# Patient Record
Sex: Female | Born: 1995 | Race: White | Hispanic: No | Marital: Single | Smoking: Never smoker
Health system: Southern US, Community
[De-identification: ages and names within clinical notes are randomized; demographics above are authoritative.]

## PROBLEM LIST (undated history)

## (undated) ENCOUNTER — Inpatient Hospital Stay (HOSPITAL_COMMUNITY): Payer: Self-pay

## (undated) DIAGNOSIS — Z789 Other specified health status: Secondary | ICD-10-CM

## (undated) DIAGNOSIS — O0933 Supervision of pregnancy with insufficient antenatal care, third trimester: Secondary | ICD-10-CM

## (undated) DIAGNOSIS — O0932 Supervision of pregnancy with insufficient antenatal care, second trimester: Secondary | ICD-10-CM

## (undated) HISTORY — PX: NO PAST SURGERIES: SHX2092

## (undated) HISTORY — DX: Supervision of pregnancy with insufficient antenatal care, third trimester: O09.33

## (undated) HISTORY — DX: Supervision of pregnancy with insufficient antenatal care, second trimester: O09.32

---

## 2014-10-10 NOTE — L&D Delivery Note (Signed)
Delivery Note At 11:33 AM a viable female was delivered via Vaginal, Spontaneous Delivery (Presentation: ; Occiput Anterior).  APGAR: 9, 9; weight 7 lb 10.9 oz (3485 g).   Placenta status: Intact, Spontaneous.  Cord: 3 vessels with the following complications: None.  Cord pH: n/a  Anesthesia: Epidural  Episiotomy: None Lacerations: 2nd degree Suture Repair: 2.0 vicryl Est. Blood Loss (mL): 100  Mom to postpartum.  Baby to Couplet care / Skin to Skin.  Burlin Mcnair DARLENE 05/03/1015 1200

## 2015-02-14 ENCOUNTER — Encounter (HOSPITAL_COMMUNITY): Payer: Self-pay

## 2015-02-14 ENCOUNTER — Inpatient Hospital Stay (HOSPITAL_COMMUNITY)
Admission: AD | Admit: 2015-02-14 | Discharge: 2015-02-14 | Disposition: A | Discharge status: Home / Self Care | Payer: Medicaid Other | Source: Ambulatory Visit | Attending: Obstetrics and Gynecology | Admitting: Obstetrics and Gynecology

## 2015-02-14 DIAGNOSIS — O0932 Supervision of pregnancy with insufficient antenatal care, second trimester: Secondary | ICD-10-CM | POA: Diagnosis not present

## 2015-02-14 DIAGNOSIS — O9989 Other specified diseases and conditions complicating pregnancy, childbirth and the puerperium: Secondary | ICD-10-CM | POA: Insufficient documentation

## 2015-02-14 DIAGNOSIS — Z3A27 27 weeks gestation of pregnancy: Secondary | ICD-10-CM | POA: Diagnosis not present

## 2015-02-14 NOTE — Discharge Instructions (Signed)
Second Trimester of Pregnancy The second trimester is from week 13 through week 28, month 4 through 6. This is often the time in pregnancy that you feel your best. Often times, morning sickness has lessened or quit. You may have more energy, and you may get hungry more often. Your unborn baby (fetus) is growing rapidly. At the end of the sixth month, he or she is about 9 inches long and weighs about 1 pounds. You will likely feel the baby move (quickening) between 18 and 20 weeks of pregnancy. HOME CARE   Avoid all smoking, herbs, and alcohol. Avoid drugs not approved by your doctor.  Only take medicine as told by your doctor. Some medicines are safe and some are not during pregnancy.  Exercise only as told by your doctor. Stop exercising if you start having cramps.  Eat regular, healthy meals.  Wear a good support bra if your breasts are tender.  Do not use hot tubs, steam rooms, or saunas.  Wear your seat belt when driving.  Avoid raw meat, uncooked cheese, and liter boxes and soil used by cats.  Take your prenatal vitamins.  Try taking medicine that helps you poop (stool softener) as needed, and if your doctor approves. Eat more fiber by eating fresh fruit, vegetables, and whole grains. Drink enough fluids to keep your pee (urine) clear or pale yellow.  Take warm water baths (sitz baths) to soothe pain or discomfort caused by hemorrhoids. Use hemorrhoid cream if your doctor approves.  If you have puffy, bulging veins (varicose veins), wear support hose. Raise (elevate) your feet for 15 minutes, 3-4 times a day. Limit salt in your diet.  Avoid heavy lifting, wear low heals, and sit up straight.  Rest with your legs raised if you have leg cramps or low back pain.  Visit your dentist if you have not gone during your pregnancy. Use a soft toothbrush to brush your teeth. Be gentle when you floss.  You can have sex (intercourse) unless your doctor tells you not to.  Go to your  doctor visits. GET HELP IF:   You feel dizzy.  You have mild cramps or pressure in your lower belly (abdomen).  You have a nagging pain in your belly area.  You continue to feel sick to your stomach (nauseous), throw up (vomit), or have watery poop (diarrhea).  You have bad smelling fluid coming from your vagina.  You have pain with peeing (urination). GET HELP RIGHT AWAY IF:   You have a fever.  You are leaking fluid from your vagina.  You have spotting or bleeding from your vagina.  You have severe belly cramping or pain.  You lose or gain weight rapidly.  You have trouble catching your breath and have chest pain.  You notice sudden or extreme puffiness (swelling) of your face, hands, ankles, feet, or legs.  You have not felt the baby move in over an hour.  You have severe headaches that do not go away with medicine.  You have vision changes. Document Released: 12/21/2009 Document Revised: 01/21/2013 Document Reviewed: 11/27/2012 ExitCare Patient Information 2015 [employer]. This information is not intended to replace advice given to you by your health care provider. Make sure you discuss any questions you have with your health care provider.  

## 2015-02-14 NOTE — [facility] Provider Note (Signed)
Ms. Anna [name] is a 19 y.o. G1P0 at 3273w2d who presents to [facility] today with complaint of no prenatal care. The patient denies abdominal pain, vaginal bleeding, LOF today. She states that she desires a water birth and has started the Medicaid process but has not found an OB yet.   BP 118/69 mmHg  Pulse 88  Temp(Src) 97.9 F (36.6 C) (Oral)  Resp 16  Ht 5' 1.75" (1.568 m)  Wt 150 lb 8 oz (68.266 kg)  BMI 27.77 kg/m2  LMP 08/07/2014 (Exact Date)  CONSTITUTIONAL: Well-developed, well-nourished female in no acute distress.  ENT: External right and left ear normal.  EYES: EOM intact, conjunctivae normal.  MUSCULOSKELETAL: Normal range of motion.  CARDIOVASCULAR: Regular heart rate RESPIRATORY: Normal effort NEUROLOGICAL: Alert and oriented to person, place, and time.  SKIN: Skin is warm and dry. No rash noted. Not diaphoretic. No erythema. No pallor. PSYCH: Normal mood and affect. Normal behavior. Normal judgment and thought content.  MDM FHR - 150 bpm with doppler  A: [facility] at 4973w2d No prenatal care  P: Discharge home Preterm labor precautions discussed Patient advised to follow-up with [facility] to start prenatal Patient may return to [facility] as needed or if her condition were to change or worsen  Marny LowensteinJulie N Tramya Schoenfelder, PA-C  02/14/2015 12:30 PM

## 2015-02-14 NOTE — MAU Note (Signed)
Pt states here for ultrasound to determine gender, as well as to start prenatal care. Denies pain or bleeding.

## 2015-02-25 ENCOUNTER — Ambulatory Visit (HOSPITAL_COMMUNITY)
Admission: RE | Admit: 2015-02-25 | Discharge: 2015-02-25 | Disposition: A | Discharge status: Home / Self Care | Payer: Medicaid Other | Source: Ambulatory Visit | Attending: Medical | Admitting: Medical

## 2015-02-25 ENCOUNTER — Ambulatory Visit (INDEPENDENT_AMBULATORY_CARE_PROVIDER_SITE_OTHER): Payer: Medicaid Other | Admitting: Advanced Practice Midwife

## 2015-02-25 ENCOUNTER — Encounter: Payer: Self-pay | Admitting: Advanced Practice Midwife

## 2015-02-25 VITALS — BP 123/80 | HR 80 | Temp 97.9°F | Wt 155.2 lb

## 2015-02-25 DIAGNOSIS — O0932 Supervision of pregnancy with insufficient antenatal care, second trimester: Secondary | ICD-10-CM | POA: Insufficient documentation

## 2015-02-25 DIAGNOSIS — O0933 Supervision of pregnancy with insufficient antenatal care, third trimester: Secondary | ICD-10-CM | POA: Insufficient documentation

## 2015-02-25 DIAGNOSIS — Z3689 Encounter for other specified antenatal screening: Secondary | ICD-10-CM | POA: Insufficient documentation

## 2015-02-25 DIAGNOSIS — Z3403 Encounter for supervision of normal first pregnancy, third trimester: Secondary | ICD-10-CM

## 2015-02-25 DIAGNOSIS — Z3483 Encounter for supervision of other normal pregnancy, third trimester: Secondary | ICD-10-CM

## 2015-02-25 DIAGNOSIS — Z36 Encounter for antenatal screening of mother: Secondary | ICD-10-CM | POA: Diagnosis not present

## 2015-02-25 DIAGNOSIS — Z3A28 28 weeks gestation of pregnancy: Secondary | ICD-10-CM | POA: Insufficient documentation

## 2015-02-25 DIAGNOSIS — Z3493 Encounter for supervision of normal pregnancy, unspecified, third trimester: Secondary | ICD-10-CM | POA: Insufficient documentation

## 2015-02-25 LAB — POCT URINALYSIS DIP (DEVICE)
BILIRUBIN URINE: NEGATIVE
Glucose, UA: NEGATIVE mg/dL
Hgb urine dipstick: NEGATIVE
Ketones, ur: NEGATIVE mg/dL
NITRITE: NEGATIVE
PH: 5 (ref 5.0–8.0)
PROTEIN: NEGATIVE mg/dL
Specific Gravity, Urine: <=1.005 (ref 1.005–1.030)
Urobilinogen, UA: 0.2 mg/dL (ref 0.0–1.0)

## 2015-02-25 NOTE — Progress Notes (Signed)
Weight gain 25-35lbs Declined tdap and flu vaccine; info given  Pt wants to have a water birth

## 2015-02-25 NOTE — Patient Instructions (Signed)
Preterm Labor Information Preterm labor is when labor starts at less than 37 weeks of pregnancy. The normal length of a pregnancy is 39 to 41 weeks. CAUSES Often, there is no identifiable underlying cause as to why a woman goes into preterm labor. One of the most common known causes of preterm labor is infection. Infections of the uterus, cervix, vagina, amniotic sac, bladder, kidney, or even the lungs (pneumonia) can cause labor to start. Other suspected causes of preterm labor include:   Urogenital infections, such as yeast infections and bacterial vaginosis.   Uterine abnormalities (uterine shape, uterine septum, fibroids, or bleeding from the placenta).   A cervix that has been operated on (it may fail to stay closed).   Malformations in the fetus.   Multiple gestations (twins, triplets, and so on).   Breakage of the amniotic sac.  RISK FACTORS  Having a previous history of preterm labor.   Having premature rupture of membranes (PROM).   Having a placenta that covers the opening of the cervix (placenta previa).   Having a placenta that separates from the uterus (placental abruption).   Having a cervix that is too weak to hold the fetus in the uterus (incompetent cervix).   Having too much fluid in the amniotic sac (polyhydramnios).   Taking illegal drugs or smoking while pregnant.   Not gaining enough weight while pregnant.   Being younger than 18 and older than 20 years old.   Having a low socioeconomic status.   Being African American. SYMPTOMS Signs and symptoms of preterm labor include:   Menstrual-like cramps, abdominal pain, or back pain.  Uterine contractions that are regular, as frequent as six in an hour, regardless of their intensity (may be mild or painful).  Contractions that start on the top of the uterus and spread down to the lower abdomen and back.   A sense of increased pelvic pressure.   A watery or bloody mucus discharge that  comes from the vagina.  TREATMENT Depending on the length of the pregnancy and other circumstances, your health care provider may suggest bed rest. If necessary, there are medicines that can be given to stop contractions and to mature the fetal lungs. If labor happens before 34 weeks of pregnancy, a prolonged hospital stay may be recommended. Treatment depends on the condition of both you and the fetus.  WHAT SHOULD YOU DO IF YOU THINK YOU ARE IN PRETERM LABOR? Call your health care provider right away. You will need to go to the hospital to get checked immediately. HOW CAN YOU PREVENT PRETERM LABOR IN FUTURE PREGNANCIES? You should:   Stop smoking if you smoke.  Maintain healthy weight gain and avoid chemicals and drugs that are not necessary.  Be watchful for any type of infection.  Inform your health care provider if you have a known history of preterm labor. Document Released: 12/17/2003 Document Revised: 05/29/2013 Document Reviewed: 10/29/2012 ExitCare Patient Information 2015 [employer]. This information is not intended to replace advice given to you by your health care provider. Make sure you discuss any questions you have with your health care provider.  Breastfeeding Challenges and Solutions Even though breastfeeding is natural, it can be challenging, especially in the first few weeks after childbirth. It is normal for problems to arise when starting to breastfeed your new baby, even if you have breastfed before. This document provides some solutions to the most common breastfeeding challenges.  CHALLENGES AND SOLUTIONS Challenge--Cracked or Sore Nipples Cracked or sore nipples   are commonly experienced by breastfeeding mothers. Cracked or sore nipples often are caused by inadequate latching (when your baby's mouth attaches to your breast to breastfeed). Soreness can also happen if your baby is not positioned properly at your breast. Although nipple cracking and soreness are  common during the first week after birth, nipple pain is never normal. If you experience nipple cracking or soreness that lasts longer than 1 week or nipple pain, call your health care provider or lactation consultant.  Solution Ensure proper latching and positioning of your baby by following the steps below:  Find a comfortable place to sit or lie down, with your neck and back well supported.  Place a pillow or rolled up blanket under your baby to bring him or her to the level of your breast (if you are seated).  Make sure that your baby's abdomen is facing your abdomen.  Gently massage your breast. With your fingertips, massage from your chest wall toward your nipple in a circular motion. This encourages milk flow. You may need to continue this action during the feeding if your milk flows slowly.  Support your breast with 4 fingers underneath and your thumb above your nipple. Make sure your fingers are well away from your nipple and your baby's mouth.  Stroke your baby's lips gently with your finger or nipple.  When your baby's mouth is open wide enough, quickly bring your baby to your breast, placing your entire nipple and as much of the colored area around your nipple (areola) as possible into your baby's mouth.  More areola should be visible above your baby's upper lip than below the lower lip.  Your baby's tongue should be between his or her lower gum and your breast.  Ensure that your baby's mouth is correctly positioned around your nipple (latched). Your baby's lips should create a seal on your breast and be turned out (everted).  It is common for your baby to suck for about 2-3 minutes in order to start the flow of breast milk. Signs that your baby has successfully latched on to your nipple include:   Quietly tugging or quietly sucking without causing you pain.   Swallowing heard between every 3-4 sucks.   Muscle movement above and in front of his or her ears with sucking.   Signs that your baby has not successfully latched on to nipple include:   Sucking sounds or smacking sounds from your baby while nursing.   Nipple pain.  Ensure that your breasts stay moisturized and healthy by:  Avoiding the use of soap on your nipples.   Wearing a supportive bra. Avoid wearing underwire-style bras or tight bras.   Air drying your nipples for 3-4 minutes after each feeding.   Using only cotton bra pads to absorb breast milk leakage. Leaking of breast milk between feedings is normal. Be sure to change the pads if they become soaked with milk.  Using lanolin on your nipples after nursing. Lanolin helps to maintain your skin's normal moisture barrier. If you use pure lanolin you do not need to wash it off before feeding your baby again. Pure lanolin is not toxic to your baby. You may also hand express a few drops of breast milk and gently massage that milk into your nipples, allowing it to air dry. Challenge--Breast Engorgement Breast engorgement is the overfilling of your breasts with breast milk. In the first few weeks after giving birth, you may experience breast engorgement. Breast engorgement can make your breasts throb   and feel hard, tightly stretched, warm, and tender. Engorgement peaks about the fifth day after you give birth. Having breast engorgement does not mean you have to stop breastfeeding your baby. Solution  Breastfeed when you feel the need to reduce the fullness of your breasts or when your baby shows signs of hunger. This is called "breastfeeding on demand."  Newborns (babies younger than 4 weeks) often breastfeed every 1-3 hours during the day. You may need to awaken your baby to feed if he or she is asleep at a feeding time.  Do not allow your baby to sleep longer than 5 hours during the night without a feeding.  Pump or hand express breast milk before breastfeeding to soften your breast, areola, and nipple.  Apply warm, moist heat (in the  shower or with warm water-soaked hand towels) just before feeding or pumping, or massage your breast before or during breastfeeding. This increases circulation and helps your milk to flow.  Completely empty your breasts when breastfeeding or pumping. Afterward, wear a snug bra (nursing or regular) or tank top for 1-2 days to signal your body to slightly decrease milk production. Only wear snug bras or tank tops to treat engorgement. Tight bras typically should be avoided by breastfeeding mothers. Once engorgement is relieved, return to wearing regular, loose-fitting clothes.  Apply ice packs to your breasts to lessen the pain from engorgement and relieve swelling, unless the ice is uncomfortable for you.  Do not delay feedings. Try to relax when it is time to feed your baby. This helps to trigger your "let-down reflex," which releases milk from your breast.  Ensure your baby is latched on to your breast and positioned properly while breastfeeding.  Allow your baby to remain at your breast as long as he or she is latched on well and actively sucking. Your baby will let you know when he or she is done breastfeeding by pulling away from your breast or falling asleep.  Avoid introducing bottles or pacifiers to your baby in the early weeks of breastfeeding. Wait to introduce these things until after resolving any breastfeeding challenges.  Try to pump your milk on the same schedule as when your baby would breastfeed if you are returning to work or away from home for an extended period.  Drink plenty of fluids to avoid dehydration, which can eventually put you at greater risk of breast engorgement. If you follow these suggestions, your engorgement should improve in 24-48 hours. If you are still experiencing difficulty, call your lactation consultant or health care provider.  Challenge--Plugged Milk Ducts Plugged milk ducts occur when the duct does not drain milk effectively and becomes swollen. Wearing  a tight-fitting nursing bra or having difficulty with latching may cause plugged milk ducts. Not drinking enough water (8-10 c [1.9-2.4 L] per day) can contribute to plugged milk ducts. Once a duct has become plugged, hard lumps, soreness, and redness may develop in your breast.  Solution Do not delay feedings. Feed your baby frequently and try to empty your breasts of milk at each feeding. Try breastfeeding from the affected side first so there is a better chance that the milk will drain completely from that breast. Apply warm, moist towels to your breasts for 5-10 minutes before feeding. Alternatively, a hot shower right before breastfeeding can provide the moist heat that can encourage milk flow. Gentle massage of the sore area before and during a feeding may also help. Avoid wearing tight clothing or bras that put pressure   on your breasts. Wear bras that offer good support to your breasts, but avoid underwire bras. If you have a plugged milk duct and develop a fever, you need to see your health care provider.  Challenge--Mastitis Mastitis is inflammation of your breast. It usually is caused by a bacterial infection and can cause flu-like symptoms. You may develop redness in your breast and a fever. Often when mastitis occurs, your breast becomes firm, warm, and very painful. The most common causes of mastitis are poor latching, ineffective sucking from your baby, consistent pressure on your breast (possibly from wearing a tight-fitting bra or shirt that restricts the milk flow), unusual stress or fatigue, or missed feedings.  Solution You will be given antibiotic medicine to treat the infection. It is still important to breastfeed frequently to empty your breasts. Continuing to breastfeed while you recover from mastitis will not harm your baby. Make sure your baby is positioned properly during every feeding. Apply moist heat to your breasts for a few minutes before feeding to help the milk flow and to help  your breasts empty more easily. Challenge--Thrush Thrush is a yeast infection that can form on your nipples, in your breast, or in your baby's mouth. It causes itching, soreness, burning or stabbing pain, and sometimes a rash.  Solution You will be given a medicated ointment for your nipples, and your baby will be given a liquid medicine for his or her mouth. It is important that you and your baby are treated at the same time because thrush can be passed between you and your baby. Change disposable nursing pads often. Any bras, towels, or clothing that come in contact with infected areas of your body or your baby's body need to be washed in very hot water every day. Wash your hands and your baby's hands often. All pacifiers, bottle nipples, or toys your baby puts in his or her mouth should be boiled once a day for 20 minutes. After 1 week of treatment, discard pacifiers and bottle nipples and buy new ones. All breast pump parts that touch the milk need to be boiled for 20 minutes every day. Challenge--Low Milk Supply You may not be producing enough milk if your baby is not gaining the proper amount of weight. Breast milk production is based on a supply-and-demand system. Your milk supply depends on how frequently and effectively your baby empties your breast. Solution The more you breastfeed and pump, the more breast milk you will produce. It is important that your baby empties at least one of your breasts at each feeding. If this is not happening, then use a breast pump or hand express any milk that remains. This will help to drain as much milk as possible at each feeding. It will also signal your body to produce more milk. If your baby is not emptying your breasts, it may be due to latching, sucking, or positioning problems. If low milk supply continues after addressing these issues, contact your health care provider or a lactation specialist as soon as possible. Challenge--Inverted or Flat Nipples Some  women have nipples that turn inward instead of protruding outward. Other women have nipples that are flat. Inverted or flat nipples can sometimes make it more difficult for your baby to latch onto your breast. Solution You may be given a small device that pulls out inverted nipples. This device should be applied right before your baby is brought to your breast. You can also try using a breast pump for a short   time before placing the baby at your breast. The pump can pull your nipple outwards to help your infant latch more easily. The baby's sucking motion will help the inverted nipple protrude as well.  If you have flat nipples, encourage your baby to latch onto your breast and feed frequently in the early days after birth. This will give your baby practice latching on correctly while your breast is still soft. When your milk supply increases, between the second and fifth day after birth and your breasts become full, your baby will have an easier time latching.  Contact a lactation consultant if you still have concerns. She or he can teach you additional techniques to address breastfeeding problems related to nipple shape and position.  FOR MORE INFORMATION [employer]: www.llli.org Document Released: 03/20/2006 Document Revised: 10/01/2013 Document Reviewed: 03/22/2013 ExitCare Patient Information 2015 [employer]. This information is not intended to replace advice given to you by your health care provider. Make sure you discuss any questions you have with your health care provider.  

## 2015-02-25 NOTE — Progress Notes (Signed)
   Subjective:    Anna [name] is a G1P0 2429w6d being seen today for her first obstetrical visit.  Her obstetrical history is significant for Late to care, teen pregnancy. Patient does intend to breast feed. Pregnancy history fully reviewed.  Patient reports no complaints.  Filed Vitals:   02/25/15 0938  BP: 123/80  Pulse: 80  Temp: 97.9 F (36.6 C)  Weight: 155 lb 3.2 oz (70.398 kg)    HISTORY: OB History  Gravida Para Term Preterm AB SAB TAB Ectopic Multiple Living  1             # Outcome Date GA Lbr Len/2nd Weight Sex Delivery Anes PTL Lv  1 Current              History reviewed. No pertinent past medical history. History reviewed. No pertinent past surgical history. History reviewed. No pertinent family history.   Exam    Uterus:   28 cm  Pelvic Exam:    Perineum: No Hemorrhoids, Normal Perineum   Vulva: normal, Bartholin's, Urethra, Skene's normal   Vagina:  normal mucosa, normal discharge   pH: NA   Cervix: nulliparous appearance and Ectropion   Adnexa: normal adnexa and no mass, fullness, tenderness   Bony Pelvis: average  System: Breast:  Declined   Skin: normal coloration and turgor, no rashes    Neurologic: oriented, normal mood, grossly non-focal   Extremities: No edema   HEENT sclera clear, anicteric, thyroid normal size   Mouth/Teeth mucous membranes moist, pharynx normal without lesions   Neck supple and no masses   Cardiovascular: regular rate and rhythm, no murmurs or gallops   Respiratory:  appears well, vitals normal, no respiratory distress, acyanotic, normal RR, neck free of mass or lymphadenopathy   Abdomen: soft, non-tender; bowel sounds normal; no masses,  no organomegaly   Urinary: urethral meatus normal      Assessment:     1. Supervision of normal pregnancy in third trimester  - Glucose Tolerance, 1 HR (50g) - Prenatal Profile - Prescript Monitor Profile(19) - Culture, OB Urine - GC/Chlamydia Probe Amp  2. Limited prenatal  care in third trimester  3. Teen pregnancy    Plan:     Initial labs drawn. Prenatal vitamins. Problem list reviewed and updated. Genetic Screening discussedL: Too late Ultrasound discussed; fetal survey: done today. Results not back. .  Follow up in 2 weeks. 1 hour GTT Refused TDaP, Flu vaccines. Plans waterbirth. Discussed requirement for class, contraindications, study.   Dorathy KinsmanSMITH, [place] 02/25/2015

## 2015-02-26 LAB — PRENATAL PROFILE (SOLSTAS)
Antibody Screen: NEGATIVE
Basophils Absolute: 0 10*3/uL (ref 0.0–0.1)
Basophils Relative: 0 % (ref 0–1)
EOS ABS: 0.1 10*3/uL (ref 0.0–0.7)
Eosinophils Relative: 1 % (ref 0–5)
HCT: 30.7 % — ABNORMAL LOW (ref 36.0–46.0)
HEMOGLOBIN: 10.4 g/dL — AB (ref 12.0–15.0)
HIV 1&2 Ab, 4th Generation: NONREACTIVE
Hepatitis B Surface Ag: NEGATIVE
Lymphocytes Relative: 10 % — ABNORMAL LOW (ref 12–46)
Lymphs Abs: 1.5 10*3/uL (ref 0.7–4.0)
MCH: 29.1 pg (ref 26.0–34.0)
MCHC: 33.9 g/dL (ref 30.0–36.0)
MCV: 85.8 fL (ref 78.0–100.0)
MPV: 10.2 fL (ref 8.6–12.4)
Monocytes Absolute: 0.9 10*3/uL (ref 0.1–1.0)
Monocytes Relative: 6 % (ref 3–12)
NEUTROS PCT: 83 % — AB (ref 43–77)
Neutro Abs: 12 10*3/uL — ABNORMAL HIGH (ref 1.7–7.7)
Platelets: 238 10*3/uL (ref 150–400)
RBC: 3.58 MIL/uL — ABNORMAL LOW (ref 3.87–5.11)
RDW: 13.3 % (ref 11.5–15.5)
RH TYPE: POSITIVE
Rubella: 1.59 Index — ABNORMAL HIGH (ref <0.90)
WBC: 14.5 10*3/uL — ABNORMAL HIGH (ref 4.0–10.5)

## 2015-02-26 LAB — CULTURE, OB URINE: Colony Count: 15000

## 2015-02-26 LAB — GC/CHLAMYDIA PROBE AMP
CT PROBE, AMP APTIMA: NEGATIVE
GC PROBE AMP APTIMA: NEGATIVE

## 2015-02-26 LAB — GLUCOSE TOLERANCE, 1 HOUR (50G) W/O FASTING: Glucose, 1 Hour GTT: 66 mg/dL — ABNORMAL LOW (ref 70–140)

## 2015-03-02 LAB — CANNABANOIDS (GC/LC/MS), URINE: THC-COOH UR CONFIRM: 86 ng/mL — AB (ref <5)

## 2015-03-03 ENCOUNTER — Encounter: Payer: Self-pay | Admitting: Advanced Practice Midwife

## 2015-03-03 DIAGNOSIS — F1290 Cannabis use, unspecified, uncomplicated: Secondary | ICD-10-CM | POA: Insufficient documentation

## 2015-03-03 LAB — PRESCRIPTION MONITORING PROFILE (19 PANEL)
Amphetamine/Meth: NEGATIVE ng/mL
Barbiturate Screen, Urine: NEGATIVE ng/mL
Benzodiazepine Screen, Urine: NEGATIVE ng/mL
Buprenorphine, Urine: NEGATIVE ng/mL
CARISOPRODOL, URINE: NEGATIVE ng/mL
COCAINE METABOLITES: NEGATIVE ng/mL
Creatinine, Urine: 20.63 mg/dL (ref >20.0)
FENTANYL URINE: NEGATIVE ng/mL
MDMA URINE: NEGATIVE ng/mL
Meperidine, Ur: NEGATIVE ng/mL
Methadone Screen, Urine: NEGATIVE ng/mL
Methaqualone: NEGATIVE ng/mL
Nitrites, Initial: NEGATIVE ug/mL
Opiate Screen, Urine: NEGATIVE ng/mL
Oxycodone Screen, Ur: NEGATIVE ng/mL
PH URINE, INITIAL: 5.9 pH (ref 4.5–8.9)
Phencyclidine, Ur: NEGATIVE ng/mL
Propoxyphene: NEGATIVE ng/mL
TAPENTADOLUR: NEGATIVE ng/mL
TRAMADOL UR: NEGATIVE ng/mL
Zolpidem, Urine: NEGATIVE ng/mL

## 2015-03-09 ENCOUNTER — Other Ambulatory Visit: Payer: Self-pay | Admitting: Obstetrics & Gynecology

## 2015-03-09 ENCOUNTER — Encounter: Payer: Self-pay | Admitting: Obstetrics & Gynecology

## 2015-03-09 DIAGNOSIS — O0933 Supervision of pregnancy with insufficient antenatal care, third trimester: Secondary | ICD-10-CM

## 2015-03-09 DIAGNOSIS — O0930 Supervision of pregnancy with insufficient antenatal care, unspecified trimester: Secondary | ICD-10-CM | POA: Insufficient documentation

## 2015-03-10 ENCOUNTER — Telehealth: Payer: Self-pay

## 2015-03-10 NOTE — Telephone Encounter (Addendum)
Per Dr. Penne LashLeggett patient needs f/u U/S to complete growth and anatomy around 03/25/15. U/S scheduled for 03/25/15 at 1015. Attempted to contact patient. Left voicemail informing her of appointment date, time and location and that she call clinic with any questions. Will inform her at appointment on 6/8 as well.

## 2015-03-18 ENCOUNTER — Ambulatory Visit (INDEPENDENT_AMBULATORY_CARE_PROVIDER_SITE_OTHER): Payer: Medicaid Other | Admitting: Family

## 2015-03-18 VITALS — BP 132/69 | HR 80 | Temp 97.8°F | Wt 158.9 lb

## 2015-03-18 DIAGNOSIS — Z3483 Encounter for supervision of other normal pregnancy, third trimester: Secondary | ICD-10-CM

## 2015-03-18 DIAGNOSIS — Z3493 Encounter for supervision of normal pregnancy, unspecified, third trimester: Secondary | ICD-10-CM

## 2015-03-18 LAB — POCT URINALYSIS DIP (DEVICE)
Bilirubin Urine: NEGATIVE
Glucose, UA: NEGATIVE mg/dL
Hgb urine dipstick: NEGATIVE
Ketones, ur: NEGATIVE mg/dL
Nitrite: NEGATIVE
Protein, ur: NEGATIVE mg/dL
Specific Gravity, Urine: 1.015 (ref 1.005–1.030)
Urobilinogen, UA: 0.2 mg/dL (ref 0.0–1.0)
pH: 7 (ref 5.0–8.0)

## 2015-03-18 NOTE — Progress Notes (Signed)
Breastfeeding tip of thew week reviewed Pt declined Tdap vaccine

## 2015-03-18 NOTE — Patient Instructions (Signed)
  Place 32-42 weeks prenatal visit patient instructions here.  

## 2015-03-18 NOTE — Progress Notes (Signed)
Subjective:    Anna [name] is a 19 y.o. female being seen today for her obstetrical visit. She is at 5452w6d gestation. Patient reports no complaints. Fetal movement: normal.  Menstrual History: OB History    Gravida Para Term Preterm AB TAB SAB Ectopic Multiple Living   1               The following portions of the patient's history were reviewed and updated as appropriate: allergies, current medications, past family history, past medical history, past social history, past surgical history and problem list.  Review of Systems Pertinent items are noted in HPI.   Objective:    BP 132/69 mmHg  Pulse 80  Temp(Src) 97.8 F (36.6 C)  Wt 158 lb 14.4 oz (72.077 kg)  LMP 08/07/2014 (Exact Date) FHT:  160 BPM  Uterine Size: 33 cm  Presentation: unsure     Assessment:   G1P0 at 6552w6d wks IUP  Plan:    28-week labs reviewed, normal Pediatrician: discussed.  Pt given list.   Reviewed family views of circumcision - leaning towards not circumcising.   Follow up in 2 Weeks.

## 2015-03-25 ENCOUNTER — Ambulatory Visit (HOSPITAL_COMMUNITY): Payer: Medicaid Other

## 2015-03-25 ENCOUNTER — Ambulatory Visit (HOSPITAL_COMMUNITY)
Admission: RE | Admit: 2015-03-25 | Discharge: 2015-03-25 | Disposition: A | Discharge status: Home / Self Care | Payer: Medicaid Other | Source: Ambulatory Visit | Attending: Obstetrics & Gynecology | Admitting: Obstetrics & Gynecology

## 2015-03-25 DIAGNOSIS — O0933 Supervision of pregnancy with insufficient antenatal care, third trimester: Secondary | ICD-10-CM | POA: Insufficient documentation

## 2015-03-25 DIAGNOSIS — Z3A32 32 weeks gestation of pregnancy: Secondary | ICD-10-CM | POA: Insufficient documentation

## 2015-03-25 DIAGNOSIS — Z36 Encounter for antenatal screening of mother: Secondary | ICD-10-CM | POA: Diagnosis present

## 2015-03-25 DIAGNOSIS — IMO0002 Reserved for concepts with insufficient information to code with codable children: Secondary | ICD-10-CM | POA: Insufficient documentation

## 2015-03-25 DIAGNOSIS — Z0489 Encounter for examination and observation for other specified reasons: Secondary | ICD-10-CM | POA: Insufficient documentation

## 2015-04-01 ENCOUNTER — Encounter: Payer: Medicaid Other | Admitting: Obstetrics and Gynecology

## 2015-04-16 ENCOUNTER — Ambulatory Visit (INDEPENDENT_AMBULATORY_CARE_PROVIDER_SITE_OTHER): Payer: Medicaid Other | Admitting: Family Medicine

## 2015-04-16 VITALS — BP 121/63 | HR 80 | Temp 98.2°F | Wt 165.6 lb

## 2015-04-16 DIAGNOSIS — O0933 Supervision of pregnancy with insufficient antenatal care, third trimester: Secondary | ICD-10-CM

## 2015-04-16 DIAGNOSIS — O09899 Supervision of other high risk pregnancies, unspecified trimester: Secondary | ICD-10-CM | POA: Insufficient documentation

## 2015-04-16 LAB — POCT URINALYSIS DIP (DEVICE)
BILIRUBIN URINE: NEGATIVE
Glucose, UA: NEGATIVE mg/dL
HGB URINE DIPSTICK: NEGATIVE
KETONES UR: NEGATIVE mg/dL
Nitrite: NEGATIVE
PH: 7 (ref 5.0–8.0)
PROTEIN: NEGATIVE mg/dL
Specific Gravity, Urine: 1.015 (ref 1.005–1.030)
Urobilinogen, UA: 0.2 mg/dL (ref 0.0–1.0)

## 2015-04-16 LAB — OB RESULTS CONSOLE GC/CHLAMYDIA
CHLAMYDIA, DNA PROBE: NEGATIVE
Gonorrhea: NEGATIVE

## 2015-04-16 LAB — OB RESULTS CONSOLE GBS: STREP GROUP B AG: NEGATIVE

## 2015-04-16 NOTE — Progress Notes (Signed)
Subjective:  Anna [name] is a 19 y.o. G1P0 at 5981w0d being seen today for ongoing prenatal care.  Patient reports no complaints.  Contractions: Irregular.  Vag. Bleeding: None. Movement: Present. Denies leaking of fluid.   The following portions of the patient's history were reviewed and updated as appropriate: allergies, current medications, past family history, past medical history, past social history, past surgical history and problem list.   Objective:   Filed Vitals:   04/16/15 0948  BP: 121/63  Pulse: 80  Temp: 98.2 F (36.8 C)  Weight: 165 lb 9.6 oz (75.116 kg)    Fetal Status: Fetal Heart Rate (bpm): 135 Fundal Height: 35 cm Movement: Present     General:  Alert, oriented and cooperative. Patient is in no acute distress.  Skin: Skin is warm and dry. No rash noted.   Cardiovascular: Normal heart rate noted  Respiratory: Normal respiratory effort, no problems with respiration noted  Abdomen: Soft, gravid, appropriate for gestational age. Pain/Pressure: Present     Vaginal: Vag. Bleeding: None.       Cervix: Not evaluated        Extremities: Normal range of motion.  Edema: Trace  Mental Status: Normal mood and affect. Normal behavior. Normal judgment and thought content.   Urinalysis: Urine Protein: Negative Urine Glucose: Negative  Assessment and Plan:  Pregnancy: G1P0 at 5981w0d  1. Insufficient prenatal care in third trimester - GC/Chlamydia Probe Amp - Culture, beta strep (group b only)   Preterm labor symptoms and general obstetric precautions including but not limited to vaginal bleeding, contractions, leaking of fluid and fetal movement were reviewed in detail with the patient.  Please refer to After Visit Summary for other counseling recommendations.   Return in about 1 week (around 04/23/2015).   Kathrynn RunningNoah Bedford Odile Veloso, MD, exam by Dr. Fredirick LatheKristy Acosta

## 2015-04-16 NOTE — Progress Notes (Signed)
Declines tdap 

## 2015-04-17 LAB — GC/CHLAMYDIA PROBE AMP
CT Probe RNA: NEGATIVE
GC Probe RNA: NEGATIVE

## 2015-04-18 LAB — CULTURE, BETA STREP (GROUP B ONLY)

## 2015-04-29 ENCOUNTER — Ambulatory Visit (INDEPENDENT_AMBULATORY_CARE_PROVIDER_SITE_OTHER): Payer: Medicaid Other | Admitting: Family

## 2015-04-29 VITALS — BP 116/66 | HR 81 | Wt 170.8 lb

## 2015-04-29 DIAGNOSIS — O0933 Supervision of pregnancy with insufficient antenatal care, third trimester: Secondary | ICD-10-CM | POA: Diagnosis present

## 2015-04-29 LAB — POCT URINALYSIS DIP (DEVICE)
Bilirubin Urine: NEGATIVE
Glucose, UA: NEGATIVE mg/dL
Hgb urine dipstick: NEGATIVE
Ketones, ur: NEGATIVE mg/dL
NITRITE: NEGATIVE
Protein, ur: NEGATIVE mg/dL
Specific Gravity, Urine: 1.025 (ref 1.005–1.030)
Urobilinogen, UA: 0.2 mg/dL (ref 0.0–1.0)
pH: 7.5 (ref 5.0–8.0)

## 2015-04-29 NOTE — Progress Notes (Signed)
Subjective:  Anna [name] is a 19 y.o. G1P0 at 2317w6d being seen today for ongoing prenatal care.  Patient reports occasional contractions.  Contractions: Not present.  Vag. Bleeding: None. Movement: Present. Denies leaking of fluid.   The following portions of the patient's history were reviewed and updated as appropriate: allergies, current medications, past family history, past medical history, past social history, past surgical history and problem list.   Objective:   Filed Vitals:   04/29/15 1005  BP: 116/66  Pulse: 81  Weight: 170 lb 12.8 oz (77.474 kg)    Fetal Status: Fetal Heart Rate (bpm): 138   Movement: Present     General:  Alert, oriented and cooperative. Patient is in no acute distress.  Skin: Skin is warm and dry. No rash noted.   Cardiovascular: Normal heart rate noted  Respiratory: Normal respiratory effort, no problems with respiration noted  Abdomen: Soft, gravid, appropriate for gestational age. Pain/Pressure: Absent     Vaginal: Vag. Bleeding: None.       Cervix: Exam revealed      1/50/-1  Extremities: Normal range of motion.  Edema: Trace  Mental Status: Normal mood and affect. Normal behavior. Normal judgment and thought content.   Urinalysis: Urine Protein: Negative Urine Glucose: Negative    Assessment and Plan:  Pregnancy: G1P0 at 7017w6d  1. Insufficient prenatal care in third trimester   Term labor symptoms and general obstetric precautions including but not limited to vaginal bleeding, contractions, leaking of fluid and fetal movement were reviewed in detail with the patient. Please refer to After Visit Summary for other counseling recommendations.  Return in about 1 week (around 05/06/2015).   Eino FarberWalidah Kennith GainN Karim, CNM

## 2015-05-02 ENCOUNTER — Encounter (HOSPITAL_COMMUNITY): Payer: Self-pay | Admitting: *Deleted

## 2015-05-02 ENCOUNTER — Inpatient Hospital Stay (HOSPITAL_COMMUNITY)
Admission: AD | Admit: 2015-05-02 | Discharge: 2015-05-04 | DRG: 775 | Disposition: A | Discharge status: Home / Self Care | Payer: Medicaid Other | Source: Ambulatory Visit | Attending: Obstetrics & Gynecology | Admitting: Obstetrics & Gynecology

## 2015-05-02 DIAGNOSIS — O4292 Full-term premature rupture of membranes, unspecified as to length of time between rupture and onset of labor: Secondary | ICD-10-CM | POA: Diagnosis present

## 2015-05-02 DIAGNOSIS — O479 False labor, unspecified: Secondary | ICD-10-CM | POA: Diagnosis present

## 2015-05-02 DIAGNOSIS — Z3A38 38 weeks gestation of pregnancy: Secondary | ICD-10-CM | POA: Diagnosis present

## 2015-05-02 DIAGNOSIS — O9989 Other specified diseases and conditions complicating pregnancy, childbirth and the puerperium: Secondary | ICD-10-CM | POA: Diagnosis present

## 2015-05-02 HISTORY — DX: Other specified health status: Z78.9

## 2015-05-02 LAB — TYPE AND SCREEN
ABO/RH(D): B POS
ANTIBODY SCREEN: NEGATIVE

## 2015-05-02 LAB — CBC
HEMATOCRIT: 30 % — AB (ref 36.0–46.0)
Hemoglobin: 9.4 g/dL — ABNORMAL LOW (ref 12.0–15.0)
MCH: 27.2 pg (ref 26.0–34.0)
MCHC: 31.3 g/dL (ref 30.0–36.0)
MCV: 86.7 fL (ref 78.0–100.0)
PLATELETS: 184 10*3/uL (ref 150–400)
RBC: 3.46 MIL/uL — ABNORMAL LOW (ref 3.87–5.11)
RDW: 14.1 % (ref 11.5–15.5)
WBC: 14.9 10*3/uL — AB (ref 4.0–10.5)

## 2015-05-02 LAB — POCT FERN TEST

## 2015-05-02 MED ORDER — CITRIC ACID-SODIUM CITRATE 334-500 MG/5ML PO SOLN: 30.0000 mL | ORAL | Status: DC | PRN

## 2015-05-02 MED ORDER — LACTATED RINGERS IV SOLN
INTRAVENOUS | Status: DC
  Administered 2015-05-03 (×2): via INTRAVENOUS

## 2015-05-02 MED ORDER — ONDANSETRON HCL 4 MG/2ML IJ SOLN: 4.0000 mg | Freq: Four times a day (QID) | INTRAMUSCULAR | Status: DC | PRN

## 2015-05-02 MED ORDER — FLEET ENEMA 7-19 GM/118ML RE ENEM: 1.0000 | ENEMA | RECTAL | Status: DC | PRN

## 2015-05-02 MED ORDER — LIDOCAINE HCL (PF) 1 % IJ SOLN
30.0000 mL | INTRAMUSCULAR | Status: DC | PRN
  Filled 2015-05-02: qty 30

## 2015-05-02 MED ORDER — OXYTOCIN 40 UNITS IN LACTATED RINGERS INFUSION - SIMPLE MED: 62.5000 mL/h | INTRAVENOUS | Status: DC

## 2015-05-02 MED ORDER — OXYTOCIN BOLUS FROM INFUSION: 500.0000 mL | INTRAVENOUS | Status: DC

## 2015-05-02 MED ORDER — LACTATED RINGERS IV SOLN: 500.0000 mL | INTRAVENOUS | Status: DC | PRN

## 2015-05-02 MED ORDER — OXYCODONE-ACETAMINOPHEN 5-325 MG PO TABS
1.0000 | ORAL_TABLET | ORAL | Status: DC | PRN
  Filled 2015-05-02 (×2): qty 1

## 2015-05-02 MED ORDER — OXYCODONE-ACETAMINOPHEN 5-325 MG PO TABS: 2.0000 | ORAL_TABLET | ORAL | Status: DC | PRN

## 2015-05-02 MED ORDER — ACETAMINOPHEN 325 MG PO TABS: 650.0000 mg | ORAL_TABLET | ORAL | Status: DC | PRN

## 2015-05-02 NOTE — MAU Note (Signed)
Pt states had leaking of fluid this am, and continues to have a trickle. No contractions.

## 2015-05-02 NOTE — H&P (Signed)
Anna [name] is a 19 y.o. female presenting for  SROM at 0500 this morning.Having no contractions. GBS neg. Wants water birth. Maternal Medical History:  Reason for admission: Rupture of membranes.   Contractions: Occasional mild  Fetal activity: Perceived fetal activity is normal.   Last perceived fetal movement was within the past hour.    Prenatal complications: no prenatal complications   OB History    Gravida Para Term Preterm AB TAB SAB Ectopic Multiple Living   1              History reviewed. No pertinent past medical history. History reviewed. No pertinent past surgical history. Family History: family history is not on file. Social History:  reports that she has never smoked. She has never used smokeless tobacco. She reports that she does not drink alcohol or use illicit drugs.   Prenatal Transfer Tool  Maternal Diabetes: No Genetic Screening: Normal Maternal Ultrasounds/Referrals: Normal Fetal Ultrasounds or other Referrals:  None Maternal Substance Abuse:  No Significant Maternal Medications:  None Significant Maternal Lab Results:  None Other Comments:  None  Review of Systems  Constitutional: Negative.   HENT: Negative.   Eyes: Negative.   Respiratory: Negative.   Cardiovascular: Negative.   Gastrointestinal: Negative.   Genitourinary: Negative.   Musculoskeletal: Negative.   Skin: Negative.   Neurological: Negative.   Endo/Heme/Allergies: Negative.   Psychiatric/Behavioral: Negative.     Dilation: 1.5 Effacement (%): 70 Station: -1 Exam by:: Anna Reamer, RN Blood pressure 129/78, pulse 85, temperature 98.3 F (36.8 C), temperature source Oral, resp. rate 18, last menstrual period 08/07/2014. Maternal Exam:  Uterine Assessment: Contraction strength is mild.  Contraction frequency is rare.   Abdomen: Patient reports no abdominal tenderness. Fundal height is 38.   Estimated fetal weight is 7.8.   Fetal presentation: vertex  Introitus: Normal  vulva. Normal vagina.  Ferning test: positive.  Amniotic fluid character: clear.  Pelvis: adequate for delivery.   Cervix: Cervix evaluated by digital exam.     Fetal Exam Fetal Monitor Review: Mode: ultrasound.   Variability: moderate (6-25 bpm).   Pattern: accelerations present.    Fetal State Assessment: Category I - tracings are normal.     Physical Exam  Constitutional: She is oriented to person, place, and time. She appears well-developed and well-nourished.  HENT:  Head: Normocephalic.  Eyes: Pupils are equal, round, and reactive to light.  Neck: Normal range of motion.  Cardiovascular: Normal rate, regular rhythm, normal heart sounds and intact distal pulses.   Respiratory: Effort normal and breath sounds normal.  GI: Soft. Bowel sounds are normal.  Genitourinary: Vagina normal and uterus normal.  Musculoskeletal: Normal range of motion.  Neurological: She is alert and oriented to person, place, and time. She has normal reflexes.  Skin: Skin is warm and dry.  Psychiatric: She has a normal mood and affect. Her behavior is normal. Judgment and thought content normal.    Prenatal labs: ABO, Rh: B/POS/-- (05/18 1238) Antibody: NEG (05/18 1238) Rubella: 1.59 (05/18 1238) RPR: NON REAC (05/18 1238)  HBsAg: NEGATIVE (05/18 1238)  HIV: NONREACTIVE (05/18 1238)  GBS: Negative (07/07 0000)   Assessment/Plan: SROM at 38.2 Admit SVE 1-2/70/-1   Anna [name] 05/02/2015, 4:42 PM

## 2015-05-02 NOTE — Progress Notes (Signed)
OK with CNM for pt to not have IV access at this time.

## 2015-05-03 ENCOUNTER — Inpatient Hospital Stay (HOSPITAL_COMMUNITY): Payer: Medicaid Other | Admitting: Anesthesiology

## 2015-05-03 ENCOUNTER — Encounter (HOSPITAL_COMMUNITY): Payer: Self-pay | Admitting: *Deleted

## 2015-05-03 DIAGNOSIS — Z3A38 38 weeks gestation of pregnancy: Secondary | ICD-10-CM

## 2015-05-03 DIAGNOSIS — O479 False labor, unspecified: Secondary | ICD-10-CM | POA: Diagnosis present

## 2015-05-03 LAB — CBC
HCT: 29.3 % — ABNORMAL LOW (ref 36.0–46.0)
Hemoglobin: 9.4 g/dL — ABNORMAL LOW (ref 12.0–15.0)
MCH: 27.2 pg (ref 26.0–34.0)
MCHC: 32.1 g/dL (ref 30.0–36.0)
MCV: 84.9 fL (ref 78.0–100.0)
PLATELETS: 186 10*3/uL (ref 150–400)
RBC: 3.45 MIL/uL — ABNORMAL LOW (ref 3.87–5.11)
RDW: 14 % (ref 11.5–15.5)
WBC: 20.4 10*3/uL — ABNORMAL HIGH (ref 4.0–10.5)

## 2015-05-03 LAB — ABO/RH: ABO/RH(D): B POS

## 2015-05-03 LAB — HIV ANTIBODY (ROUTINE TESTING W REFLEX): HIV Screen 4th Generation wRfx: NONREACTIVE

## 2015-05-03 LAB — RPR: RPR: NONREACTIVE

## 2015-05-03 MED ORDER — ONDANSETRON HCL 4 MG/2ML IJ SOLN: 4.0000 mg | Freq: Four times a day (QID) | INTRAMUSCULAR | Status: DC | PRN

## 2015-05-03 MED ORDER — FENTANYL 2.5 MCG/ML BUPIVACAINE 1/10 % EPIDURAL INFUSION (WH - ANES)
INTRAMUSCULAR | Status: AC
  Filled 2015-05-03: qty 125

## 2015-05-03 MED ORDER — OXYCODONE-ACETAMINOPHEN 5-325 MG PO TABS
1.0000 | ORAL_TABLET | ORAL | Status: DC | PRN
  Administered 2015-05-03: 1 via ORAL

## 2015-05-03 MED ORDER — CITRIC ACID-SODIUM CITRATE 334-500 MG/5ML PO SOLN: 30.0000 mL | ORAL | Status: DC | PRN

## 2015-05-03 MED ORDER — SODIUM CHLORIDE 0.9 % IV SOLN: 250.0000 mL | INTRAVENOUS | Status: DC | PRN

## 2015-05-03 MED ORDER — FENTANYL CITRATE (PF) 100 MCG/2ML IJ SOLN
100.0000 ug | INTRAMUSCULAR | Status: DC | PRN
  Administered 2015-05-03 (×2): 100 ug via INTRAVENOUS
  Filled 2015-05-03 (×2): qty 2

## 2015-05-03 MED ORDER — MISOPROSTOL 50MCG HALF TABLET
50.0000 ug | ORAL_TABLET | ORAL | Status: DC
Start: 2015-05-03 — End: 2015-05-03

## 2015-05-03 MED ORDER — ZOLPIDEM TARTRATE 5 MG PO TABS: 5.0000 mg | ORAL_TABLET | Freq: Every evening | ORAL | Status: DC | PRN

## 2015-05-03 MED ORDER — WITCH HAZEL-GLYCERIN EX PADS: 1.0000 "application " | MEDICATED_PAD | CUTANEOUS | Status: DC | PRN

## 2015-05-03 MED ORDER — OXYTOCIN 40 UNITS IN LACTATED RINGERS INFUSION - SIMPLE MED
1.0000 m[IU]/min | INTRAVENOUS | Status: DC
  Administered 2015-05-03: 2 m[IU]/min via INTRAVENOUS
  Filled 2015-05-03: qty 1000

## 2015-05-03 MED ORDER — TERBUTALINE SULFATE 1 MG/ML IJ SOLN: 0.2500 mg | Freq: Once | INTRAMUSCULAR | Status: DC | PRN

## 2015-05-03 MED ORDER — BENZOCAINE-MENTHOL 20-0.5 % EX AERO
1.0000 "application " | INHALATION_SPRAY | CUTANEOUS | Status: DC | PRN
  Administered 2015-05-03: 1 via TOPICAL
  Filled 2015-05-03: qty 56

## 2015-05-03 MED ORDER — ACETAMINOPHEN 325 MG PO TABS: 650.0000 mg | ORAL_TABLET | ORAL | Status: DC | PRN

## 2015-05-03 MED ORDER — ONDANSETRON HCL 4 MG/2ML IJ SOLN: 4.0000 mg | INTRAMUSCULAR | Status: DC | PRN

## 2015-05-03 MED ORDER — BUPIVACAINE HCL (PF) 0.25 % IJ SOLN
INTRAMUSCULAR | Status: DC | PRN
  Administered 2015-05-03 (×2): 4 mL

## 2015-05-03 MED ORDER — SODIUM CHLORIDE 0.9 % IJ SOLN: 3.0000 mL | Freq: Two times a day (BID) | INTRAMUSCULAR | Status: DC

## 2015-05-03 MED ORDER — DIBUCAINE 1 % RE OINT
1.0000 | TOPICAL_OINTMENT | RECTAL | Status: DC | PRN
Start: 2015-05-03 — End: 2015-05-04

## 2015-05-03 MED ORDER — SODIUM CHLORIDE 0.9 % IJ SOLN: 3.0000 mL | INTRAMUSCULAR | Status: DC | PRN

## 2015-05-03 MED ORDER — MISOPROSTOL 200 MCG PO TABS
50.0000 ug | ORAL_TABLET | ORAL | Status: DC
  Administered 2015-05-03: 50 ug via ORAL
  Filled 2015-05-03: qty 0.5

## 2015-05-03 MED ORDER — LACTATED RINGERS IV SOLN: INTRAVENOUS | Status: DC

## 2015-05-03 MED ORDER — OXYCODONE-ACETAMINOPHEN 5-325 MG PO TABS: 2.0000 | ORAL_TABLET | ORAL | Status: DC | PRN

## 2015-05-03 MED ORDER — OXYTOCIN 40 UNITS IN LACTATED RINGERS INFUSION - SIMPLE MED: 62.5000 mL/h | INTRAVENOUS | Status: DC

## 2015-05-03 MED ORDER — LIDOCAINE-EPINEPHRINE (PF) 2 %-1:200000 IJ SOLN
INTRAMUSCULAR | Status: DC | PRN
  Administered 2015-05-03: 4 mL

## 2015-05-03 MED ORDER — LIDOCAINE HCL (PF) 1 % IJ SOLN
30.0000 mL | INTRAMUSCULAR | Status: DC | PRN
  Filled 2015-05-03: qty 30

## 2015-05-03 MED ORDER — EPHEDRINE 5 MG/ML INJ
10.0000 mg | INTRAVENOUS | Status: DC | PRN
  Filled 2015-05-03: qty 2

## 2015-05-03 MED ORDER — ONDANSETRON HCL 4 MG PO TABS: 4.0000 mg | ORAL_TABLET | ORAL | Status: DC | PRN

## 2015-05-03 MED ORDER — TETANUS-DIPHTH-ACELL PERTUSSIS 5-2.5-18.5 LF-MCG/0.5 IM SUSP: 0.5000 mL | Freq: Once | INTRAMUSCULAR | Status: DC

## 2015-05-03 MED ORDER — PRENATAL MULTIVITAMIN CH
1.0000 | ORAL_TABLET | Freq: Every day | ORAL | Status: DC
  Filled 2015-05-03: qty 1

## 2015-05-03 MED ORDER — DIPHENHYDRAMINE HCL 25 MG PO CAPS: 25.0000 mg | ORAL_CAPSULE | Freq: Four times a day (QID) | ORAL | Status: DC | PRN

## 2015-05-03 MED ORDER — FENTANYL 2.5 MCG/ML BUPIVACAINE 1/10 % EPIDURAL INFUSION (WH - ANES)
14.0000 mL/h | INTRAMUSCULAR | Status: DC | PRN
  Administered 2015-05-03 (×2): 14 mL/h via EPIDURAL

## 2015-05-03 MED ORDER — PHENYLEPHRINE 40 MCG/ML (10ML) SYRINGE FOR IV PUSH (FOR BLOOD PRESSURE SUPPORT)
80.0000 ug | PREFILLED_SYRINGE | INTRAVENOUS | Status: DC | PRN
  Filled 2015-05-03: qty 2

## 2015-05-03 MED ORDER — SENNOSIDES-DOCUSATE SODIUM 8.6-50 MG PO TABS
2.0000 | ORAL_TABLET | ORAL | Status: DC
  Administered 2015-05-03: 2 via ORAL
  Filled 2015-05-03: qty 2

## 2015-05-03 MED ORDER — TERBUTALINE SULFATE 1 MG/ML IJ SOLN
0.2500 mg | Freq: Once | INTRAMUSCULAR | Status: AC | PRN
  Filled 2015-05-03: qty 1

## 2015-05-03 MED ORDER — OXYTOCIN BOLUS FROM INFUSION: 500.0000 mL | INTRAVENOUS | Status: DC

## 2015-05-03 MED ORDER — OXYTOCIN 40 UNITS IN LACTATED RINGERS INFUSION - SIMPLE MED: 62.5000 mL/h | INTRAVENOUS | Status: DC | PRN

## 2015-05-03 MED ORDER — PHENYLEPHRINE 40 MCG/ML (10ML) SYRINGE FOR IV PUSH (FOR BLOOD PRESSURE SUPPORT)
PREFILLED_SYRINGE | INTRAVENOUS | Status: AC
  Filled 2015-05-03: qty 20

## 2015-05-03 MED ORDER — LACTATED RINGERS IV SOLN: 500.0000 mL | INTRAVENOUS | Status: DC | PRN

## 2015-05-03 MED ORDER — DIPHENHYDRAMINE HCL 50 MG/ML IJ SOLN: 12.5000 mg | INTRAMUSCULAR | Status: DC | PRN

## 2015-05-03 MED ORDER — IBUPROFEN 600 MG PO TABS
600.0000 mg | ORAL_TABLET | Freq: Four times a day (QID) | ORAL | Status: DC
  Administered 2015-05-03 – 2015-05-04 (×3): 600 mg via ORAL
  Filled 2015-05-03 (×4): qty 1

## 2015-05-03 MED ORDER — LANOLIN HYDROUS EX OINT: TOPICAL_OINTMENT | CUTANEOUS | Status: DC | PRN

## 2015-05-03 MED ORDER — SIMETHICONE 80 MG PO CHEW: 80.0000 mg | CHEWABLE_TABLET | ORAL | Status: DC | PRN

## 2015-05-03 NOTE — Progress Notes (Signed)
   Anna [name] is a 19 y.o. G1P0 at [redacted]w[redacted]d  admitted for Premature rupture of membranes at 5 am on 05/02/2015.   Subjective: Coping well with contractions after Fentanyl dose.   Objective: Filed Vitals:   05/02/15 2318 05/03/15 0053 05/03/15 0300 05/03/15 0511  BP: 125/63 119/61  120/55  Pulse: 91 81  86  Temp: 98.4 F (36.9 C) 98.3 F (36.8 C) 98.6 F (37 C) 98.9 F (37.2 C)  TempSrc: Oral Oral Oral Oral  Resp: 18 18    Height:      Weight:          FHT:  FHR: 145 bpm, variability: moderate,  accelerations:  Present,  decelerations:  Absent UC:   irregular, every 1-3 minutes SVE:   Dilation: 4 Effacement (%): 90 Station: -1 Exam by:: Lilli Few, RN  Labs: Lab Results  Component Value Date   WBC 14.9* 05/02/2015   HGB 9.4* 05/02/2015   HCT 30.0* 05/02/2015   MCV 86.7 05/02/2015   PLT 184 05/02/2015    Assessment / Plan: Induction of labor due to PROM.  Labor: Progressing slowly after 1 dose of 50 mcg of cytotec. Will discontinue cytotec and start pit at 2 x 1  Fetal Wellbeing:  Category I Pain Control:  Fentanyl Anticipated MOD:  NSVD  Charlesetta Garibaldi Sieara Bremer SNM 05/03/2015, 5:27 AM

## 2015-05-03 NOTE — Progress Notes (Signed)
Anna [name] is a 19 y.o. G1P0 at [redacted]w[redacted]d by ultrasound admitted for rupture of membranes  Subjective:   Objective: BP 134/69 mmHg  Pulse 94  Temp(Src) 98.5 F (36.9 C) (Oral)  Resp 16  Ht 5' 1.75" (1.568 m)  Wt 170 lb 12.8 oz (77.474 kg)  BMI 31.51 kg/m2  LMP 08/07/2014 (Exact Date)      FHT:  FHR: 125-130 bpm, variability: moderate,  accelerations:  Present,  decelerations:  Absent UC:   regular, every 2 minutes SVE:   Dilation: 7.5 Effacement (%): 90 Station: -1, 0 Exam by:: J.Thornton, RN  Labs: Lab Results  Component Value Date   WBC 14.9* 05/02/2015   HGB 9.4* 05/02/2015   HCT 30.0* 05/02/2015   MCV 86.7 05/02/2015   PLT 184 05/02/2015    Assessment / Plan: Augmentation of labor, progressing well  Labor: Progressing normally Preeclampsia:  no signs or symptoms of toxicity and intake and ouput balanced Fetal Wellbeing:  Category I Pain Control:  Epidural I/D:  n/a Anticipated MOD:  NSVD  Anna [name] DARLENE 05/03/2015, 10:27 AM

## 2015-05-03 NOTE — Anesthesia Preprocedure Evaluation (Signed)

## 2015-05-03 NOTE — Progress Notes (Signed)
Patient ID: Haylo Fake, female   DOB: 01/26/1996, 19 y.o.   MRN: [id number] Shantell Belongia is a 19 y.o. G1P0 at [redacted]w[redacted]d.  Subjective: Denies contractions. Continues leaking fluid.   Objective: 98.3 F (36.8 C)  95  --  18  131/62 mmHg   FHT:  FHR: 140 bpm, variability: mod,  accelerations:  15x15,  decelerations:  none UC:   Q 5 minutes, mild  Dilation: 1.5 Effacement (%): 70 Cervical Position: Middle Station: -2 Presentation: Vertex Exam by:: [employer]: Results for orders placed or performed during the hospital encounter of 05/02/15 (from the past 24 hour(s))  Fern Test     Status: Abnormal   Collection Time: 05/02/15  4:16 PM  Result Value Ref Range   POCT Fern Test    HIV antibody     Status: None   Collection Time: 05/02/15  5:15 PM  Result Value Ref Range   HIV Screen 4th Generation wRfx Non Reactive Non Reactive  CBC     Status: Abnormal   Collection Time: 05/02/15  5:15 PM  Result Value Ref Range   WBC 14.9 (H) 4.0 - 10.5 K/uL   RBC 3.46 (L) 3.87 - 5.11 MIL/uL   Hemoglobin 9.4 (L) 12.0 - 15.0 g/dL   HCT 30.8 (L) 65.7 - 84.6 %   MCV 86.7 78.0 - 100.0 fL   MCH 27.2 26.0 - 34.0 pg   MCHC 31.3 30.0 - 36.0 g/dL   RDW 96.2 95.2 - 84.1 %   Platelets 184 150 - 400 K/uL  Type and screen     Status: None   Collection Time: 05/02/15  5:15 PM  Result Value Ref Range   ABO/RH(D) B POS    Antibody Screen NEG    Sample Expiration 05/05/2015   ABO/Rh     Status: None   Collection Time: 05/02/15  5:15 PM  Result Value Ref Range   ABO/RH(D) B POS     Assessment / Plan: [redacted]w[redacted]d week IUP Labor: PROM. No labor after 18 hours expectant management. Fetal Wellbeing:  Category I Pain Control:  Planning waterbirth Anticipated MOD:  SVD Recommend IOL from PROM. Discussed risk of chorio after prolonged ROM. Discussed Cytotec vs Foley followed by pitocin and that IOL will most likely interfere w/ plans for waterbirth. Pt requests cytotec.   [place],  CNM 05/03/2015 10:14 AM

## 2015-05-03 NOTE — Anesthesia Procedure Notes (Signed)
Epidural Patient location during procedure: OB  Staffing Anesthesiologist: Ival Pacer, CHRIS Performed by: anesthesiologist   Preanesthetic Checklist Completed: patient identified, surgical consent, pre-op evaluation, timeout performed, IV checked, risks and benefits discussed and monitors and equipment checked  Epidural Patient position: sitting Prep: DuraPrep Patient monitoring: heart rate, cardiac monitor, continuous pulse ox and blood pressure Approach: midline Location: L3-L4 Injection technique: LOR saline  Needle:  Needle type: Tuohy  Needle gauge: 17 G Needle length: 9 cm Needle insertion depth: 8 cm Catheter type: closed end flexible Catheter size: 19 Gauge Catheter at skin depth: 13 cm Test dose: negative and 2% lidocaine with Epi 1:200 K  Assessment Events: blood not aspirated, injection not painful, no injection resistance, negative IV test and no paresthesia  Additional Notes Reason for block:procedure for pain

## 2015-05-04 LAB — RPR: RPR: NONREACTIVE

## 2015-05-04 NOTE — Lactation Note (Signed)
This note was copied from the chart of Anna Jesselyn Rask. Lactation Consultation Note New mom holding baby in cradle position BF (attempting). Baby was smacking lips suckling on tip of nipple. Talked with mom about latching. Referred to Baby and Me Book in Breastfeeding section Pg. 22-23 for position options and Proper latch demonstration. Demonstrated the chin tug d/t baby constantly after wide flange obtained, clamps down and closes flange. Repositioned baby to football hold, repositioned mom sitting upright. Latched w/wide flange several times and finally sustained a good latch. FOB involved and watching.  Mom is fair skinned, red hair, light colored pink nipples. Mom states she had breast changes. Denies painful latches. Hand expression taught w/good colostrum.  Mom has tubular breast w/2 fingers wideth space between breast. Has good breast tissue.  Has short shaft nipples but very compressible areolas to obtain a deep latch. Encouraged ocassional breast massage during BF. Mom encouraged to do skin-to-skin. Mom encouraged to feed baby 8-12 times/24 hours and with feeding cues. Mom encouraged to waken baby for feeds. Educated about newborn behavior, I&O, supply and demand. WH/LC brochure given w/resources, support groups and LC services. Patient Name: Anna [name] Date: 05/04/2015 Reason for consult: Initial assessment   Maternal Data Has patient been taught Hand Expression?: Yes Does the patient have breastfeeding experience prior to this delivery?: No  Feeding Feeding Type: Breast Fed Length of feed: 10 min (still BF)  LATCH Score/Interventions Latch: Repeated attempts needed to sustain latch, nipple held in mouth throughout feeding, stimulation needed to elicit sucking reflex. Intervention(s): Adjust position;Assist with latch;Breast massage;Breast compression  Audible Swallowing: A few with stimulation Intervention(s): Skin to skin;Hand expression Intervention(s): Hand  expression;Alternate breast massage  Type of Nipple: Everted at rest and after stimulation  Comfort (Breast/Nipple): Soft / non-tender     Hold (Positioning): Assistance needed to correctly position infant at breast and maintain latch. Intervention(s): Skin to skin;Position options;Support Pillows;Breastfeeding basics reviewed  LATCH Score: 7  Lactation Tools Discussed/Used     Consult Status Consult Status: Follow-up Date: 05/04/15 (in pm) Follow-up type: In-patient    [name], Diamond Nickel 05/04/2015, 2:27 AM

## 2015-05-04 NOTE — Discharge Instructions (Signed)

## 2015-05-04 NOTE — Clinical Social Work Maternal (Signed)
CLINICAL SOCIAL WORK MATERNAL/CHILD NOTE  Patient Details  Name: Anna [name] MRN: [id number] Date of Birth: 1995/12/27  Date:  05/04/2015  Clinical Social Worker Initiating Note:  Loleta Books, LCSW Date/ Time Initiated:  05/04/15/1110     Child's Name:  Darvin Neighbours   Legal Guardian:  Harvie Junior and Fayrene Fearing   Need for Interpreter:  None   Date of Referral:  05/03/15     Reason for Referral:  Current Substance Use/Substance Use During Pregnancy , Late or No Prenatal Care    Referral Source:  [facility]   Address:  [address] [place], [place] [address]  Phone number:  [phone]   Household Members:  Significant Other   Natural Supports (not living in the home):  Extended Family, Immediate Family   Professional Supports: None   Employment: Homemaker   Type of Work:   N/A  Education:    N/A  Architect:  Medicaid   Other Resources:  Sales executive , [employer]   Cultural/Religious Considerations Which May Impact Care:  None reported  Strengths:  Home prepared for child , Merchandiser, retail , Ability to meet basic needs    Risk Factors/Current Problems:   1)Substance Use: MOB reported infrequent THC use during the pregnancy to assist with nausea and braxton hicks.  MOB reported last use 4-5 days ago. Infant's UDS and MDS are pending.    Cognitive State:  Able to Concentrate , Alert , Goal Oriented , Linear Thinking    Mood/Affect:  Interested , Happy , Comfortable    CSW Assessment:  CSW received request for consult due to MOB presenting late to prenatal care ([redacted]w[redacted]d) and THC use during pregnancy.  MOB presented as easily engaged and receptive to the visit. MOB was noted to be breastfeeding and interacting with the infant during the visit.  She was noted to be in a pleasant mood and displayed a full range in affect.  MOB discussed feeling of happiness and excitement as she prepares to be discharged home. She stated that she feels well  supported by her family and has the home prepared for the infant.  MOB denied mental health history, denied mental health concerns during the pregnancy.  MOB presented as attentive and engaged during education on perinatal mood and anxiety disorders, and agreed to contact her medical provider if she notes onset of symptoms.   MOB acknowledges late prenatal care. She stated that she originally had limited access to transportation which delayed applying for Medicaid and then starting care. MOB denied ongoing barriers to accessing care since she now has access to reliable transportation. MOB originally denied substance use until CSW inquired about THC use that is listed in her chart. MOB reported infrequent use during the pregnancy to assist with nausea and braxton hicks.  MOB shared that she tried not to use THC, but felt that there were no other options for her.  MOB verbalized understanding of the hospital drug screen policy, and acknowledged that CPS will be contacted if there is a positive drug screen.  MOB became appropriately worried as she inquired about what to expect with CPS involvement, and shared that she felt better once she knew what to expect.    MOB denied additional questions, concerns, or needs at this time. She agreed to contact CSW if needs arise during the admission.   CSW Plan/Description:   1)Patient/Family Education: Perinatal mood and anxiety disorders, hospital drug screen policy  2)CSW to monitor infant's UDS and MDS.  CSW to make CPS report if positive for substances.  3) No Further Intervention Required/No Barriers to Discharge    Kelby Fam 05/04/2015, 12:50 PM

## 2015-05-04 NOTE — Lactation Note (Signed)
This note was copied from the chart of Anna [name]. Lactation Consultation Note  Patient Name: Anna [name] Date: 05/04/2015 Reason for consult: Follow-up assessment   With this mom and term baby, now 21 hours oldand at 2% wt loss. I assisted mom with latching and positioning for football hold. The baby latched easily, with good suckles and visible swalows. Mom has easily expressed colostrum. Breast care and hand expresion and use of hand pump reiviwed. Mom and dad would like an early discharge today, and have already made a peds appt for tomorrow. Mom seems to be doling well with breastfeeidng   Maternal Data Formula Feeding for Exclusion: No Has patient been taught Hand Expression?: Yes Does the patient have breastfeeding experience prior to this delivery?: No  Feeding Feeding Type: Breast Fed Length of feed: 20 min  LATCH Score/Interventions Latch: Grasps breast easily, tongue down, lips flanged, rhythmical sucking. Intervention(s): Assist with latch  Audible Swallowing: A few with stimulation Intervention(s): Hand expression  Type of Nipple: Everted at rest and after stimulation  Comfort (Breast/Nipple): Soft / non-tender     Hold (Positioning): Assistance needed to correctly position infant at breast and maintain latch. Intervention(s): Breastfeeding basics reviewed;Support Pillows;Position options;Skin to skin  LATCH Score: 8  Lactation Tools Discussed/Used     Consult Status Consult Status: Complete Follow-up type: Call as needed    Alfred Levins 05/04/2015, 8:56 AM

## 2015-05-04 NOTE — Progress Notes (Signed)
Post Partum Day 1 Subjective: no complaints, up ad lib, voiding and tolerating PO  Objective: Blood pressure 101/58, pulse 75, temperature 98.4 F (36.9 C), temperature source Axillary, resp. rate 18, height 5' 1.75" (1.568 m), weight 170 lb 12.8 oz (77.474 kg), last menstrual period 08/07/2014, unknown if currently breastfeeding.  Physical Exam:  General: alert, cooperative, appears stated age and no distress Lochia: appropriate Uterine Fundus: firm Incision: healing well DVT Evaluation: No evidence of DVT seen on physical exam. Negative Homan's sign. No cords or calf tenderness.   Recent Labs  05/02/15 1715 05/03/15 1454  HGB 9.4* 9.4*  HCT 30.0* 29.3*    Assessment/Plan: Plan for discharge tomorrow   LOS: 2 days   Khaza Blansett DARLENE 05/04/2015, 7:18 AM

## 2015-05-04 NOTE — Anesthesia Postprocedure Evaluation (Signed)
  Anesthesia Post-op Note  Patient: Anna [name]  Procedure(s) Performed: * No procedures listed *  Patient Location: Mother/Baby  Anesthesia Type:Epidural  Level of Consciousness: awake, alert , oriented and patient cooperative  Airway and Oxygen Therapy: Patient Spontanous Breathing  Post-op Pain: mild  Post-op Assessment: Patient's Cardiovascular Status Stable, Respiratory Function Stable, No headache, No backache and Patient able to bend at knees              Post-op Vital Signs: Reviewed and stable  Last Vitals:  Filed Vitals:   05/04/15 0645  BP: 101/58  Pulse: 75  Temp: 36.9 C  Resp: 18    Complications: No apparent anesthesia complications

## 2015-05-06 ENCOUNTER — Telehealth: Payer: Self-pay | Admitting: General Practice

## 2015-05-06 ENCOUNTER — Encounter: Payer: Medicaid Other | Admitting: Family

## 2015-05-06 NOTE — Telephone Encounter (Signed)
Patient called into front office stating her stitches are hurting and the pain increases when she is up and walking. Asked patient if she was doing anything for the pain or if she had been given prescriptions. Patient states no. Recommended ibuprofen  every 8 hours as needed as well as dermoplast/lidocaine spray and witch hazel/tucks. Patient verbalized understanding and states she thinks she also has a hemorrhoid and has had difficulty with bowel movements. Told patient the previous recommended medications will help with the hemorrhoid and also recommended stool softeners  twice a day and if that doesn't help she may also try miralax. Patient verbalized understanding and states she just didn't know if she should be concerned about infection. Told patient that infection is usually unlikely especially if she is keeping area cleaned. Told patient if her pain isn't improving over the next several days despite recommended treatments, she may call us back. Patient verbalized understanding and had no other questions

## 2015-05-13 ENCOUNTER — Encounter: Payer: Medicaid Other | Admitting: Family Medicine

## 2015-06-08 NOTE — Discharge Summary (Signed)
Physician Discharge Summary  Patient ID: Anna [name] MRN: [id number] DOB/AGE: Jul 16, 1996 19 y.o.  Admit date: 05/02/2015 Discharge date: 05/04/2015 Admission Diagnoses: PROM  Discharge Diagnoses: SVD- Delivered Active Problems:   Indication for care in labor or delivery   Irregular labor   Discharged Condition: good  Hospital Course: uneventful  Consults: Social Work- late prenatal care and THC use during pregnancy  Discharge Exam: Blood pressure 101/58, pulse 75, temperature 98.4 F (36.9 C), temperature source Axillary, resp. rate 18, height 5' 1.75" (1.568 m), weight 170 lb 12.8 oz (77.474 kg), last menstrual period 08/07/2014, unknown if currently breastfeeding. General appearance: alert and cooperative Resp: clear to auscultation bilaterally Chest wall: no tenderness GI: soft, non-tender; bowel sounds normal; no masses,  no organomegaly Extremities: extremities normal, atraumatic, no cyanosis or edema and Homans sign is negative, no sign of DVT Skin: Skin color, texture, turgor normal. No rashes or lesions  Disposition: 01-Home or Self Care     Medication List    TAKE these medications        PRENATAL VITAMINS PLUS 27-1 MG Tabs  Take 1 tablet by mouth daily.           Follow-up Information    Follow up with [facility] In 6 weeks.   Specialty:  Obstetrics and Gynecology   Why:  Call office tomorrow to schedule appointment   Contact information:   [address] [place] [address] [phone]      Signed: Leeroy Cha 05/04/2015 @ 204pm

## 2015-06-17 ENCOUNTER — Ambulatory Visit: Payer: Medicaid Other | Admitting: Obstetrics and Gynecology

## 2016-05-11 IMAGING — US US OB COMP +14 WK
1 series · 12 of 28 positions shown · non-contrast
Comparison: none

[Series 1: us ob +14 all · 12 of 75 slices shown]
[im 3/75]
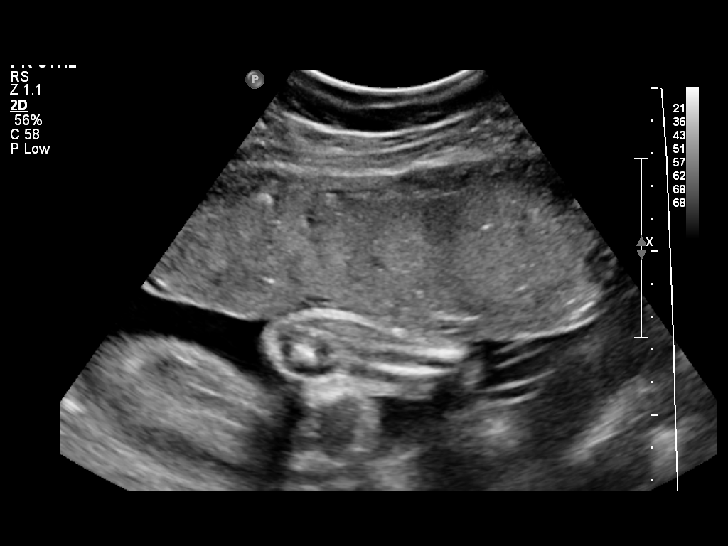
[im 9/75]
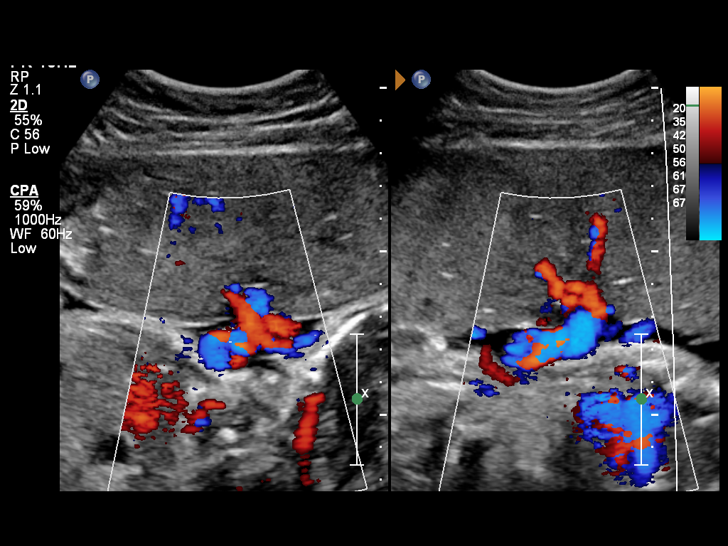
[im 14/75]
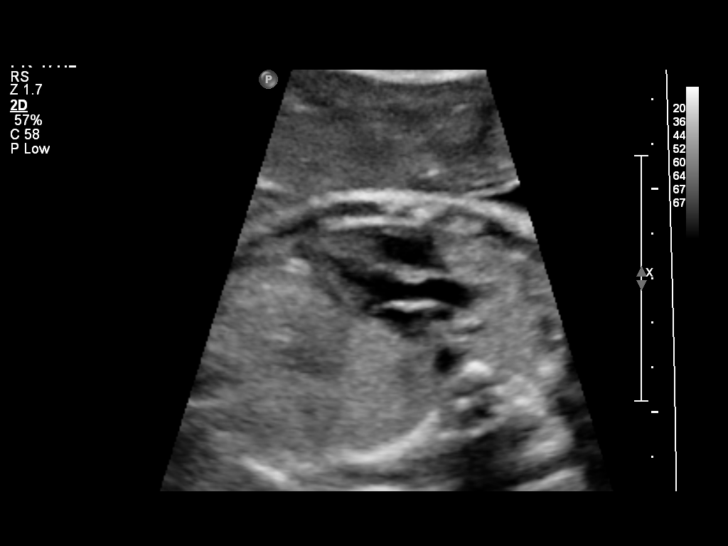
[im 22/75]
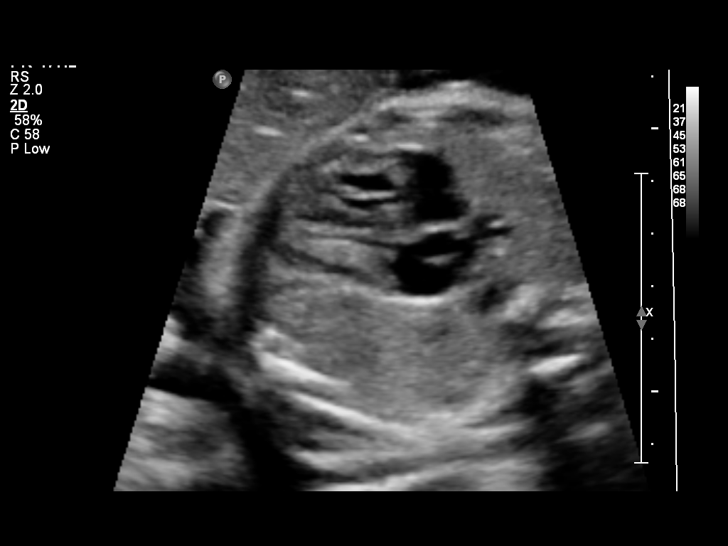
[im 28/75]
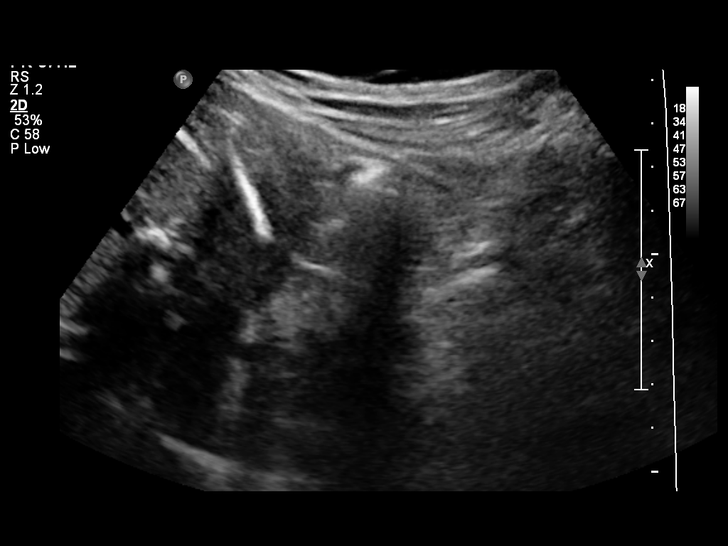
[im 33/75]
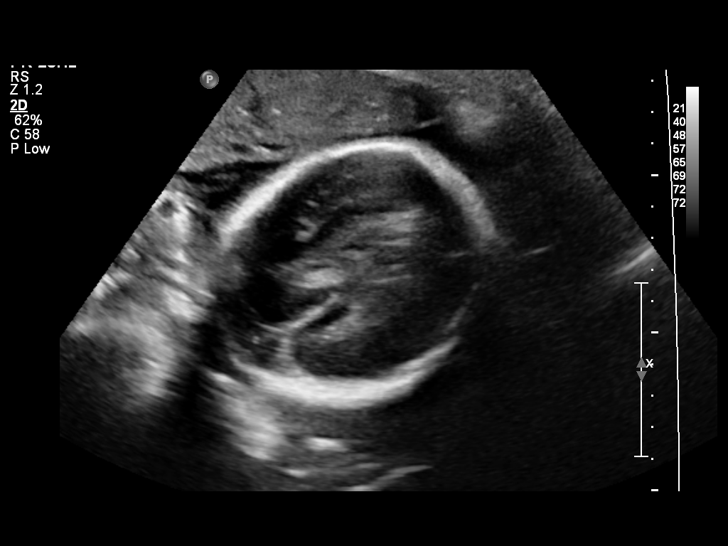
[im 42/75]
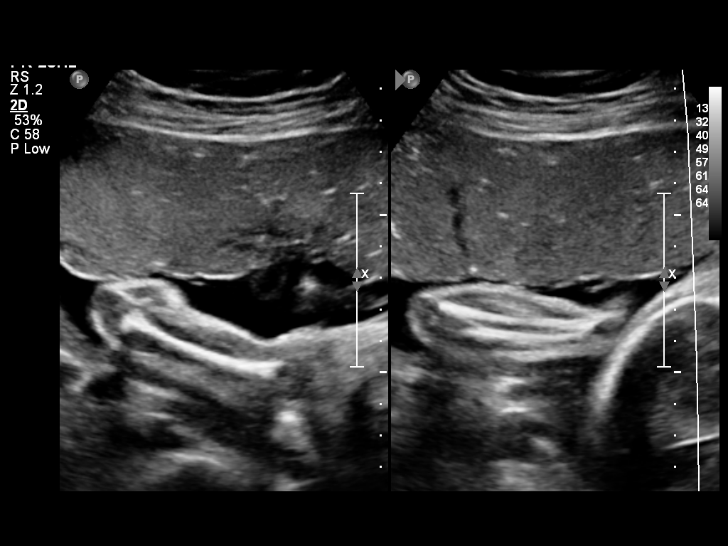
[im 47/75]
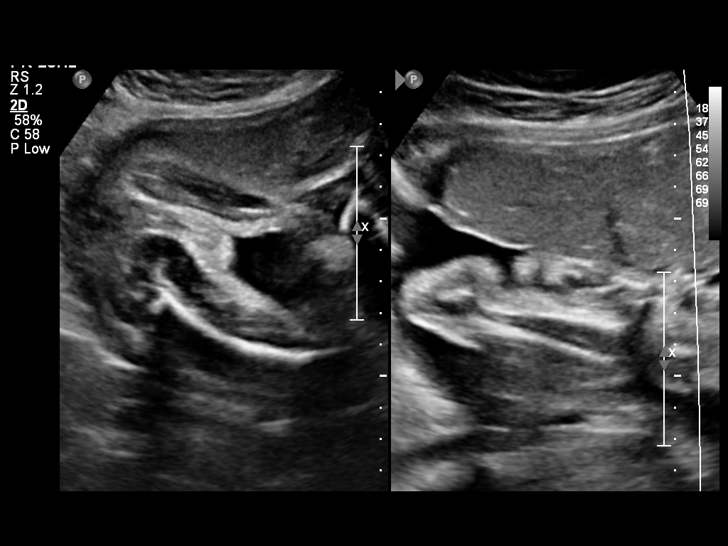
[im 53/75]
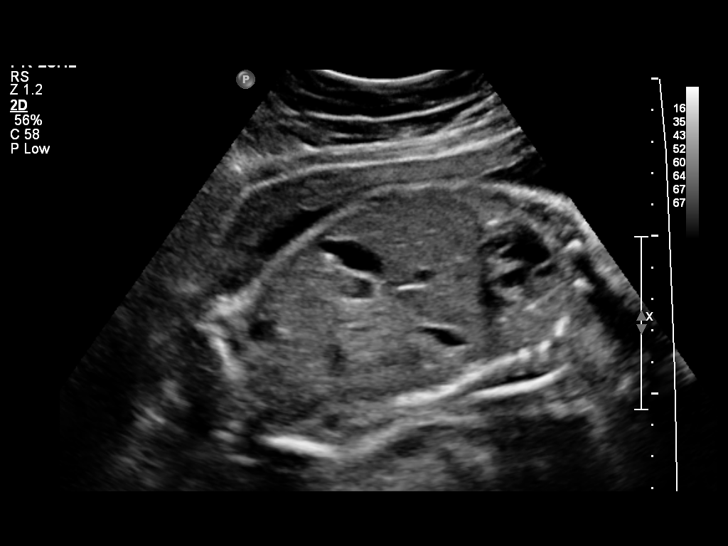
[im 61/75]
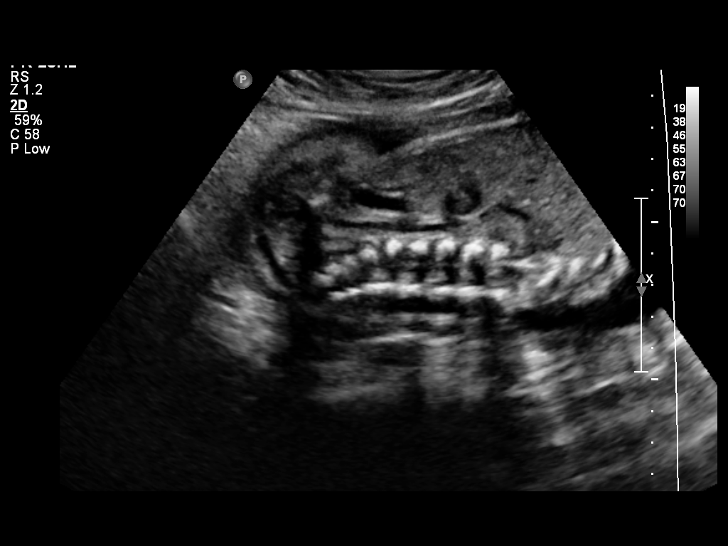
[im 66/75]
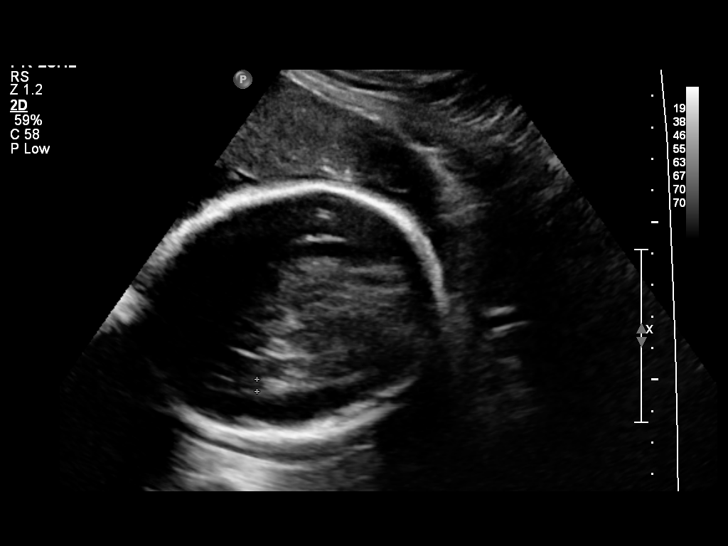
[im 72/75]
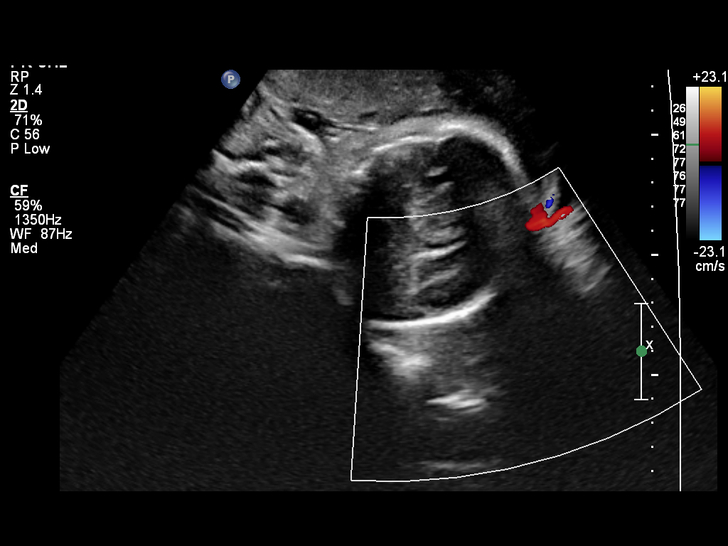

[12 of 28 positions shown; findings below may reference images not displayed]

OBSTETRICS REPORT
(Signed Final 02/25/2015 [DATE])

Service(s) Provided

US OB COMP + 14 WK                                    76805.1
Indications

Basic anatomic survey                                 z36
Uncertain LMP  Establish Gestational Age              Z36
No Prenatal Care
Fetal Evaluation

Num Of Fetuses:    1
Fetal Heart Rate:  140                          bpm
Cardiac Activity:  Observed
Presentation:      Cephalic
Placenta:          Anterior, above cervical os
P. Cord            Visualized, central
Insertion:

Amniotic Fluid
AFI FV:      Subjectively low-normal
AFI Sum:     11.15   cm       21  %Tile     Larg Pckt:    5.43  cm
RUQ:   5.43    cm   RLQ:    2.3    cm    LUQ:   1.15    cm   LLQ:    2.27   cm
Biometry

BPD:     76.3  mm     G. Age:  30w 4d                CI:        73.31   70 - 86
FL/HC:      18.9   19.6 -
20.8
HC:     283.2  mm     G. Age:  31w 0d       81  %    HC/AC:      1.14   0.99 -
1.21
AC:     249.5  mm     G. Age:  29w 1d       53  %    FL/BPD:     70.2   71 - 87
FL:      53.6  mm     G. Age:  28w 3d       24  %    FL/AC:      21.5   20 - 24
HUM:     50.5  mm     G. Age:  29w 4d       59  %

Est. FW:    0454  gm           3 lb     58  %
Gestational Age

LMP:           28w 6d        Date:  08/07/14                 EDD:   05/14/15
U/S Today:     29w 5d                                        EDD:   05/08/15
Best:          28w 6d     Det. By:  Early Ultrasound         EDD:   05/14/15
Anatomy
Cranium:          Previously seen        Aortic Arch:      Appears normal
Fetal Cavum:      Not well visualized    Ductal Arch:      Appears normal
Ventricles:       Appears normal         Diaphragm:        Appears normal
Choroid Plexus:   Not well visualized    Stomach:          Appears normal, left
sided
Cerebellum:       Not well visualized    Abdomen:          Appears normal
Posterior Fossa:  Not well visualized    Abdominal Wall:   Not well visualized
Nuchal Fold:      Not applicable (>20    Cord Vessels:     Appears normal (3
wks GA)                                  vessel cord)
Face:             Orbits appear          Kidneys:          Appear normal
normal
Lips:             Appears normal         Bladder:          Appears normal
Palate:           Not well visualized    Spine:            Not well visualized
Heart:            Appears normal         Lower             Present
(4CH, axis, and        Extremities:
situs)
RVOT:             Appears normal         Upper             Present
Extremities:
LVOT:             Appears normal

Other:  Fetus appears to be a male. Technically difficult due to low amniotic
fluid and fetal position.
Targeted Anatomy

Fetal Central Nervous System
Lat. Ventricles:
Cervix Uterus Adnexa

Cervix:       Not visualized (advanced GA >01wks)
Left Ovary:    Not visualized.
Right Ovary:   Size(cm) L: 2.73 x W: 1.95 x H: 1.48  Volume(cc):

Adnexa:     No abnormality visualized.
Impression

Single IUP at 28w 6d by LMP (confirmed by early ultrasound
from [facility] - records not available for review)
Normal fetal anatomic survey
Limited views of the cranial anatomy and abdominal wall
obtained due to fetal position
The remainder of the fetal anatomy appears normal
The estimated fetal weight is at the 58th %tile.
Normal amniotic fluid volume
Recommendations

Recommend follow-up ultrasound examination in 4 weeks for
growth and to complete anatomy

questions or concerns.

## 2017-11-14 ENCOUNTER — Encounter: Payer: Self-pay | Admitting: *Deleted

## 2020-06-25 ENCOUNTER — Other Ambulatory Visit: Payer: Self-pay | Admitting: *Deleted

## 2020-06-25 DIAGNOSIS — Z3401 Encounter for supervision of normal first pregnancy, first trimester: Secondary | ICD-10-CM

## 2020-07-01 ENCOUNTER — Encounter: Payer: Self-pay | Admitting: *Deleted

## 2020-07-02 ENCOUNTER — Encounter (INDEPENDENT_AMBULATORY_CARE_PROVIDER_SITE_OTHER): Payer: Medicaid Other | Admitting: Obstetrics & Gynecology

## 2020-07-02 ENCOUNTER — Other Ambulatory Visit: Payer: Self-pay

## 2020-07-02 ENCOUNTER — Ambulatory Visit (INDEPENDENT_AMBULATORY_CARE_PROVIDER_SITE_OTHER): Payer: Medicaid Other

## 2020-07-02 DIAGNOSIS — Z3401 Encounter for supervision of normal first pregnancy, first trimester: Secondary | ICD-10-CM | POA: Diagnosis not present

## 2020-07-06 ENCOUNTER — Encounter: Payer: Self-pay | Admitting: Obstetrics & Gynecology

## 2020-07-06 DIAGNOSIS — N83202 Unspecified ovarian cyst, left side: Secondary | ICD-10-CM | POA: Insufficient documentation

## 2020-07-06 NOTE — Progress Notes (Signed)
° °  Patient was rescheduled for her appointment.   Jaynie Collins, MD, FACOG Obstetrician & Gynecologist, [facility] for [employer], [facility]

## 2020-07-07 ENCOUNTER — Other Ambulatory Visit (HOSPITAL_COMMUNITY)
Admission: RE | Admit: 2020-07-07 | Discharge: 2020-07-07 | Disposition: A | Discharge status: Home / Self Care | Payer: Medicaid Other | Source: Ambulatory Visit | Attending: Certified Nurse Midwife | Admitting: Certified Nurse Midwife

## 2020-07-07 ENCOUNTER — Ambulatory Visit (INDEPENDENT_AMBULATORY_CARE_PROVIDER_SITE_OTHER): Payer: Medicaid Other | Admitting: Certified Nurse Midwife

## 2020-07-07 ENCOUNTER — Other Ambulatory Visit: Payer: Self-pay

## 2020-07-07 ENCOUNTER — Encounter: Payer: Self-pay | Admitting: Certified Nurse Midwife

## 2020-07-07 VITALS — BP 110/66 | HR 82 | Wt 186.0 lb

## 2020-07-07 DIAGNOSIS — Z3480 Encounter for supervision of other normal pregnancy, unspecified trimester: Secondary | ICD-10-CM | POA: Insufficient documentation

## 2020-07-07 DIAGNOSIS — Z3A10 10 weeks gestation of pregnancy: Secondary | ICD-10-CM

## 2020-07-07 NOTE — Patient Instructions (Signed)
Safe Medications in Pregnancy   Acne: Benzoyl Peroxide Salicylic Acid  Backache/Headache: Tylenol: 2 regular strength every 4 hours OR              2 Extra strength every 6 hours  Colds/Coughs/Allergies: Benadryl (alcohol free) 25 mg every 6 hours as needed Breath right strips Claritin Cepacol throat lozenges Chloraseptic throat spray Cold-Eeze- up to three times per day Cough drops, alcohol free Flonase (by prescription only) Guaifenesin Mucinex Robitussin DM (plain only, alcohol free) Saline nasal spray/drops Sudafed (pseudoephedrine) & Actifed ** use only after [redacted] weeks gestation and if you do not have high blood pressure Tylenol Vicks Vaporub Zinc lozenges Zyrtec   Constipation: Colace Ducolax suppositories Fleet enema Glycerin suppositories Metamucil Milk of magnesia Miralax Senokot Smooth move tea  Diarrhea: Kaopectate Imodium A-D  *NO pepto Bismol  Hemorrhoids: Anusol Anusol HC Preparation H Tucks  Indigestion: Tums Maalox Mylanta Zantac  Pepcid  Insomnia: Benadryl (alcohol free) 25mg every 6 hours as needed Tylenol PM Unisom, no Gelcaps  Leg Cramps: Tums MagGel  Nausea/Vomiting:  Bonine Dramamine Emetrol Ginger extract Sea bands Meclizine  Nausea medication to take during pregnancy:  Unisom (doxylamine succinate 25 mg tablets) Take one tablet daily at bedtime. If symptoms are not adequately controlled, the dose can be increased to a maximum recommended dose of two tablets daily (1/2 tablet in the morning, 1/2 tablet mid-afternoon and one at bedtime). Vitamin B6 100mg tablets. Take one tablet twice a day (up to 200 mg per day).  Skin Rashes: Aveeno products Benadryl cream or 25mg every 6 hours as needed Calamine Lotion 1% cortisone cream  Yeast infection: Gyne-lotrimin 7 Monistat 7   **If taking multiple medications, please check labels to avoid duplicating the same active ingredients **take medication as directed on  the label ** Do not exceed 4000 mg of tylenol in 24 hours **Do not take medications that contain aspirin or ibuprofen     First Trimester of Pregnancy  The first trimester of pregnancy is from week 1 until the end of week 13 (months 1 through 3). During this time, your baby will begin to develop inside you. At 6-8 weeks, the eyes and face are formed, and the heartbeat can be seen on ultrasound. At the end of 12 weeks, all the baby's organs are formed. Prenatal care is all the medical care you receive before the birth of your baby. Make sure you get good prenatal care and follow all of your doctor's instructions. Follow these instructions at home: Medicines  Take over-the-counter and prescription medicines only as told by your doctor. Some medicines are safe and some medicines are not safe during pregnancy.  Take a prenatal vitamin that contains at least 600 micrograms (mcg) of folic acid.  If you have trouble pooping (constipation), take medicine that will make your stool soft (stool softener) if your doctor approves. Eating and drinking   Eat regular, healthy meals.  Your doctor will tell you the amount of weight gain that is right for you.  Avoid raw meat and uncooked cheese.  If you feel sick to your stomach (nauseous) or throw up (vomit): ? Eat 4 or 5 small meals a day instead of 3 large meals. ? Try eating a few soda crackers. ? Drink liquids between meals instead of during meals.  To prevent constipation: ? Eat foods that are high in fiber, like fresh fruits and vegetables, whole grains, and beans. ? Drink enough fluids to keep your pee (urine) clear or pale yellow.   Activity  Exercise only as told by your doctor. Stop exercising if you have cramps or pain in your lower belly (abdomen) or low back.  Do not exercise if it is too hot, too humid, or if you are in a place of great height (high altitude).  Try to avoid standing for long periods of time. Move your legs often  if you must stand in one place for a long time.  Avoid heavy lifting.  Wear low-heeled shoes. Sit and stand up straight.  You can have sex unless your doctor tells you not to. Relieving pain and discomfort  Wear a good support bra if your breasts are sore.  Take warm water baths (sitz baths) to soothe pain or discomfort caused by hemorrhoids. Use hemorrhoid cream if your doctor says it is okay.  Rest with your legs raised if you have leg cramps or low back pain.  If you have puffy, bulging veins (varicose veins) in your legs: ? Wear support hose or compression stockings as told by your doctor. ? Raise (elevate) your feet for 15 minutes, 3-4 times a day. ? Limit salt in your food. Prenatal care  Schedule your prenatal visits by the twelfth week of pregnancy.  Write down your questions. Take them to your prenatal visits.  Keep all your prenatal visits as told by your doctor. This is important. Safety  Wear your seat belt at all times when driving.  Make a list of emergency phone numbers. The list should include numbers for family, friends, the hospital, and police and fire departments. General instructions  Ask your doctor for a referral to a local prenatal class. Begin classes no later than at the start of month 6 of your pregnancy.  Ask for help if you need counseling or if you need help with nutrition. Your doctor can give you advice or tell you where to go for help.  Do not use hot tubs, steam rooms, or saunas.  Do not douche or use tampons or scented sanitary pads.  Do not cross your legs for long periods of time.  Avoid all herbs and alcohol. Avoid drugs that are not approved by your doctor.  Do not use any tobacco products, including cigarettes, chewing tobacco, and electronic cigarettes. If you need help quitting, ask your doctor. You may get counseling or other support to help you quit.  Avoid cat litter boxes and soil used by cats. These carry germs that can  cause birth defects in the baby and can cause a loss of your baby (miscarriage) or stillbirth.  Visit your dentist. At home, brush your teeth with a soft toothbrush. Be gentle when you floss. Contact a doctor if:  You are dizzy.  You have mild cramps or pressure in your lower belly.  You have a nagging pain in your belly area.  You continue to feel sick to your stomach, you throw up, or you have watery poop (diarrhea).  You have a bad smelling fluid coming from your vagina.  You have pain when you pee (urinate).  You have increased puffiness (swelling) in your face, hands, legs, or ankles. Get help right away if:  You have a fever.  You are leaking fluid from your vagina.  You have spotting or bleeding from your vagina.  You have very bad belly cramping or pain.  You gain or lose weight rapidly.  You throw up blood. It may look like coffee grounds.  You are around people who have German measles, fifth disease,   or chickenpox.  You have a very bad headache.  You have shortness of breath.  You have any kind of trauma, such as from a fall or a car accident. Summary  The first trimester of pregnancy is from week 1 until the end of week 13 (months 1 through 3).  To take care of yourself and your unborn baby, you will need to eat healthy meals, take medicines only if your doctor tells you to do so, and do activities that are safe for you and your baby.  Keep all follow-up visits as told by your doctor. This is important as your doctor will have to ensure that your baby is healthy and growing well. This information is not intended to replace advice given to you by your health care provider. Make sure you discuss any questions you have with your health care provider. Document Revised: 01/17/2019 Document Reviewed: 10/04/2016 Elsevier Patient Education  2020 Elsevier Inc.  

## 2020-07-07 NOTE — Progress Notes (Signed)
History:   Anna [name] is a 24 y.o. G2P1001 at [redacted]w[redacted]d by early ultrasound being seen today for her first obstetrical visit.  Her obstetrical history is significant for smoker. Patient reports hx of tobacco use, patient reports cessation once she found out she was pregnant. Patient does intend to breast feed. Pregnancy history fully reviewed.  Patient reports nausea. Patient reports occasional morning sickness. Currently not taking any medication.      HISTORY: OB History  Gravida Para Term Preterm AB Living  2 1 1  0 0 1  SAB TAB Ectopic Multiple Live Births  0 0 0 0 1    # Outcome Date GA Lbr Len/2nd Weight Sex Delivery Anes PTL Lv  2 Current           1 Term 05/03/15 [redacted]w[redacted]d 06:52 / 00:41 7 lb 10.9 oz (3.485 kg) M Vag-Spont EPI  LIV     Apgar1: 9  Apgar5: 9     Past Medical History:  Diagnosis Date  . Insufficient prenatal care in third trimester     Clinic  [facility] Prenatal Labs  Dating LMP Blood type: B/POS/-- (05/18 1238)   Genetics Too late Antibody:NEG (05/18 1238)  Anatomic 02-01-1983 Nml but limited due gestational age (33 weeks) Rubella: 1.59 (05/18 1238)  GTT Early:               Third trimester: 66 RPR: NON REAC (05/18 1238)   Flu vaccine  Declined HBsAg: NEGATIVE (05/18 1238)   TDaP vaccine       declines                                         . Medical history non-contributory   . No prenatal care in current pregnancy in second trimester    Past Surgical History:  Procedure Laterality Date  . NO PAST SURGERIES     Family History  Problem Relation Age of Onset  . Diabetes Paternal Grandfather    Social History   Tobacco Use  . Smoking status: Never Smoker  . Smokeless tobacco: Never Used  Substance Use Topics  . Alcohol use: No  . Drug use: No   No Known Allergies Current Outpatient Medications on File Prior to Visit  Medication Sig Dispense Refill  . Prenatal Vit-Fe Fumarate-FA (PRENATAL VITAMINS PLUS) 27-1 MG TABS Take 1 tablet by mouth daily.     No current  facility-administered medications on file prior to visit.    Review of Systems Pertinent items noted in HPI and remainder of comprehensive ROS otherwise negative. Physical Exam:   Vitals:   07/07/20 1420  BP: 110/66  Pulse: 82  Weight: 186 lb (84.4 kg)    System: General: well-developed, well-nourished female in no acute distress   Skin: normal coloration and turgor, no rashes   Neurologic: oriented, normal, negative, normal mood   Extremities: normal strength, tone, and muscle mass, ROM of all joints is normal   HEENT PERRLA, extraocular movement intact and sclera clear   Mouth/Teeth mucous membranes moist, pharynx normal without lesions and dental hygiene good   Neck supple and no masses   Cardiovascular: regular rate and rhythm   Respiratory:  no respiratory distress, normal breath sounds   Abdomen: soft, non-tender; bowel sounds normal; no masses,  no organomegaly    Assessment:    Pregnancy: G2P1001 Patient Active Problem List   Diagnosis Date Noted  .  Supervision of other normal pregnancy, antepartum 07/07/2020  . Left ovarian cyst 07/06/2020  . 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 Supervision of other normal pregnancy, antepartum - Welcomed back to practice and introduced self to patient  - Recent US last week confirming pregnancy and FHR - dating based on early [place]  - Reviewed safety, visitor policy, reassurance about COVID-19 for pregnancy at this time. Discussed possible changes to visits, including televisits, that may occur due to COVID-19.  The office remains open if pt needs to be seen and MAU is open 24 hours/day for OB emergencies. - Anticipatory guidance on upcoming appointments  - Obstetric panel - Hepatitis C Antibody - Culture, OB Urine - GC/Chlamydia probe amp ([place])not at [facility] - Hemoglobinopathy Evaluation - Babyscripts Schedule Optimization   Initial labs drawn. Continue prenatal vitamins. Problem list reviewed and updated. Genetic  Screening discussed, AFP and NIPS: declined. Ultrasound discussed; fetal anatomic survey: requested. Anticipatory guidance about prenatal visits given including labs, ultrasounds, and testing. Discussed usage of Babyscripts and virtual visits as additional source of managing and completing prenatal visits in midst of coronavirus and pandemic.   Encouraged to complete MyChart Registration for her ability to review results, send requests, and have questions addressed.  The nature of [place] [facility]Faculty Practice with multiple MDs and Advanced Practice Providers was explained to patient; also emphasized that residents, students are part of our team. Routine obstetric precautions reviewed. Encouraged to seek out care at office or emergency room [facility] preferred) for urgent and/or emergent concerns. Return in about 2 weeks (around 07/21/2020) for ROB/HR with doppler.     Sharyon Cable, CNM Center for [employer], [facility]

## 2020-07-07 NOTE — Progress Notes (Signed)
Pt declines pap smear

## 2020-07-08 LAB — GC/CHLAMYDIA PROBE AMP (~~LOC~~) NOT AT ARMC
Chlamydia: NEGATIVE
Comment: NEGATIVE
Comment: NORMAL
Neisseria Gonorrhea: NEGATIVE

## 2020-07-09 LAB — HEMOGLOBINOPATHY EVALUATION
Fetal Hemoglobin Testing: <1 % (ref 0.0–1.9)
HCT: 38.8 % (ref 35.0–45.0)
Hemoglobin A2 - HGBRFX: 2.6 % (ref 2.2–3.2)
Hemoglobin: 12.7 g/dL (ref 11.7–15.5)
Hgb A: 97.4 % (ref >96.0)
MCH: 29.2 pg (ref 27.0–33.0)
MCV: 89.2 fL (ref 80.0–100.0)
RBC: 4.35 10*6/uL (ref 3.80–5.10)
RDW: 12.7 % (ref 11.0–15.0)

## 2020-07-09 LAB — OBSTETRIC PANEL
Absolute Monocytes: 968 cells/uL — ABNORMAL HIGH (ref 200–950)
Antibody Screen: NOT DETECTED
Basophils Absolute: 24 cells/uL (ref 0–200)
Basophils Relative: 0.2 %
Eosinophils Absolute: 230 cells/uL (ref 15–500)
Eosinophils Relative: 1.9 %
HCT: 38.3 % (ref 35.0–45.0)
Hemoglobin: 12.8 g/dL (ref 11.7–15.5)
Hepatitis B Surface Ag: NONREACTIVE
Lymphs Abs: 2130 cells/uL (ref 850–3900)
MCH: 29.4 pg (ref 27.0–33.0)
MCHC: 33.4 g/dL (ref 32.0–36.0)
MCV: 88 fL (ref 80.0–100.0)
MPV: 11.2 fL (ref 7.5–12.5)
Monocytes Relative: 8 %
Neutro Abs: 8748 cells/uL — ABNORMAL HIGH (ref 1500–7800)
Neutrophils Relative %: 72.3 %
Platelets: 272 10*3/uL (ref 140–400)
RBC: 4.35 10*6/uL (ref 3.80–5.10)
RDW: 12.9 % (ref 11.0–15.0)
RPR Ser Ql: NONREACTIVE
Rubella: 2.35 Index
Total Lymphocyte: 17.6 %
WBC: 12.1 10*3/uL — ABNORMAL HIGH (ref 3.8–10.8)

## 2020-07-09 LAB — HEPATITIS C ANTIBODY
Hepatitis C Ab: NONREACTIVE
SIGNAL TO CUT-OFF: 0.04 (ref <1.00)

## 2020-07-09 LAB — CULTURE, OB URINE

## 2020-07-09 LAB — URINE CULTURE, OB REFLEX

## 2020-07-21 ENCOUNTER — Other Ambulatory Visit: Payer: Self-pay

## 2020-07-21 ENCOUNTER — Ambulatory Visit (INDEPENDENT_AMBULATORY_CARE_PROVIDER_SITE_OTHER): Payer: Medicaid Other | Admitting: Advanced Practice Midwife

## 2020-07-21 VITALS — BP 122/66 | HR 82 | Wt 187.0 lb

## 2020-07-21 DIAGNOSIS — O26851 Spotting complicating pregnancy, first trimester: Secondary | ICD-10-CM

## 2020-07-21 DIAGNOSIS — Z3480 Encounter for supervision of other normal pregnancy, unspecified trimester: Secondary | ICD-10-CM

## 2020-07-21 DIAGNOSIS — Z3A12 12 weeks gestation of pregnancy: Secondary | ICD-10-CM

## 2020-07-21 NOTE — Progress Notes (Signed)
   PRENATAL VISIT NOTE  Subjective:  Anna [name] is a 24 y.o. G2P1001 at [redacted]w[redacted]d being seen today for ongoing prenatal care.  She is currently monitored for the following issues for this low-risk pregnancy and has Marijuana use; Left ovarian cyst; and Supervision of other normal pregnancy, antepartum on their problem list.  Patient reports spotting off and on after intercourse.  Contractions: Not present. Vag. Bleeding: None.  Movement: Absent. Denies leaking of fluid.   The following portions of the patient's history were reviewed and updated as appropriate: allergies, current medications, past family history, past medical history, past social history, past surgical history and problem list.   Objective:   Vitals:   07/21/20 1522  BP: 122/66  Pulse: 82  Weight: 187 lb (84.8 kg)    Fetal Status: Fetal Heart Rate (bpm): 164   Movement: Absent     General:  Alert, oriented and cooperative. Patient is in no acute distress.  Skin: Skin is warm and dry. No rash noted.   Cardiovascular: Normal heart rate noted  Respiratory: Normal respiratory effort, no problems with respiration noted  Abdomen: Soft, gravid, appropriate for gestational age.  Pain/Pressure: Absent     Pelvic: Cervical exam deferred        Extremities: Normal range of motion.  Edema: None  Mental Status: Normal mood and affect. Normal behavior. Normal judgment and thought content.   Assessment and Plan:  Pregnancy: G2P1001 at [redacted]w[redacted]d 1. Supervision of other normal pregnancy, antepartum --Anticipatory guidance about next visits/weeks of pregnancy given. --NIPS testing today --Next visit in 4 weeks in office for AFP  2. [redacted] weeks gestation of pregnancy   3. Spotting affecting pregnancy in first trimester --FHT heard today, no bleeding today, but light bleeding x 2-3 days after intercourse --Outpatient [place] ordered  Preterm labor symptoms and general obstetric precautions including but not limited to vaginal bleeding,  contractions, leaking of fluid and fetal movement were reviewed in detail with the patient. Please refer to After Visit Summary for other counseling recommendations.   Return in about 4 weeks (around 08/18/2020).  No future appointments.  Sharen Counter, CNM

## 2020-07-21 NOTE — Patient Instructions (Signed)
First Trimester of Pregnancy The first trimester of pregnancy is from week 1 until the end of week 13 (months 1 through 3). A week after a sperm fertilizes an egg, the egg will implant on the wall of the uterus. This embryo will begin to develop into a baby. Genes from you and your partner will form the baby. The female genes will determine whether the baby will be a boy or a girl. At 6-8 weeks, the eyes and face will be formed, and the heartbeat can be seen on ultrasound. At the end of 12 weeks, all the baby's organs will be formed. Now that you are pregnant, you will want to do everything you can to have a healthy baby. Two of the most important things are to get good prenatal care and to follow your health care provider's instructions. Prenatal care is all the medical care you receive before the baby's birth. This care will help prevent, find, and treat any problems during the pregnancy and childbirth. Body changes during your first trimester Your body goes through many changes during pregnancy. The changes vary from woman to woman.  You may gain or lose a couple of pounds at first.  You may feel sick to your stomach (nauseous) and you may throw up (vomit). If the vomiting is uncontrollable, call your health care provider.  You may tire easily.  You may develop headaches that can be relieved by medicines. All medicines should be approved by your health care provider.  You may urinate more often. Painful urination may mean you have a bladder infection.  You may develop heartburn as a result of your pregnancy.  You may develop constipation because certain hormones are causing the muscles that push stool through your intestines to slow down.  You may develop hemorrhoids or swollen veins (varicose veins).  Your breasts may begin to grow larger and become tender. Your nipples may stick out more, and the tissue that surrounds them (areola) may become darker.  Your gums may bleed and may be  sensitive to brushing and flossing.  Dark spots or blotches (chloasma, mask of pregnancy) may develop on your face. This will likely fade after the baby is born.  Your menstrual periods will stop.  You may have a loss of appetite.  You may develop cravings for certain kinds of food.  You may have changes in your emotions from day to day, such as being excited to be pregnant or being concerned that something may go wrong with the pregnancy and baby.  You may have more vivid and strange dreams.  You may have changes in your hair. These can include thickening of your hair, rapid growth, and changes in texture. Some women also have hair loss during or after pregnancy, or hair that feels dry or thin. Your hair will most likely return to normal after your baby is born. What to expect at prenatal visits During a routine prenatal visit:  You will be weighed to make sure you and the baby are growing normally.  Your blood pressure will be taken.  Your abdomen will be measured to track your baby's growth.  The fetal heartbeat will be listened to between weeks 10 and 14 of your pregnancy.  Test results from any previous visits will be discussed. Your health care provider may ask you:  How you are feeling.  If you are feeling the baby move.  If you have had any abnormal symptoms, such as leaking fluid, bleeding, severe headaches, or abdominal   cramping.  If you are using any tobacco products, including cigarettes, chewing tobacco, and electronic cigarettes.  If you have any questions. Other tests that may be performed during your first trimester include:  Blood tests to find your blood type and to check for the presence of any previous infections. The tests will also be used to check for low iron levels (anemia) and protein on red blood cells (Rh antibodies). Depending on your risk factors, or if you previously had diabetes during pregnancy, you may have tests to check for high blood sugar  that affects pregnant women (gestational diabetes).  Urine tests to check for infections, diabetes, or protein in the urine.  An ultrasound to confirm the proper growth and development of the baby.  Fetal screens for spinal cord problems (spina bifida) and Down syndrome.  HIV (human immunodeficiency virus) testing. Routine prenatal testing includes screening for HIV, unless you choose not to have this test.  You may need other tests to make sure you and the baby are doing well. Follow these instructions at home: Medicines  Follow your health care provider's instructions regarding medicine use. Specific medicines may be either safe or unsafe to take during pregnancy.  Take a prenatal vitamin that contains at least 600 micrograms (mcg) of folic acid.  If you develop constipation, try taking a stool softener if your health care provider approves. Eating and drinking   Eat a balanced diet that includes fresh fruits and vegetables, whole grains, good sources of protein such as meat, eggs, or tofu, and low-fat dairy. Your health care provider will help you determine the amount of weight gain that is right for you.  Avoid raw meat and uncooked cheese. These carry germs that can cause birth defects in the baby.  Eating four or five small meals rather than three large meals a day may help relieve nausea and vomiting. If you start to feel nauseous, eating a few soda crackers can be helpful. Drinking liquids between meals, instead of during meals, also seems to help ease nausea and vomiting.  Limit foods that are high in fat and processed sugars, such as fried and sweet foods.  To prevent constipation: ? Eat foods that are high in fiber, such as fresh fruits and vegetables, whole grains, and beans. ? Drink enough fluid to keep your urine clear or pale yellow. Activity  Exercise only as directed by your health care provider. Most women can continue their usual exercise routine during  pregnancy. Try to exercise for 30 minutes at least 5 days a week. Exercising will help you: ? Control your weight. ? Stay in shape. ? Be prepared for labor and delivery.  Experiencing pain or cramping in the lower abdomen or lower back is a good sign that you should stop exercising. Check with your health care provider before continuing with normal exercises.  Try to avoid standing for long periods of time. Move your legs often if you must stand in one place for a long time.  Avoid heavy lifting.  Wear low-heeled shoes and practice good posture.  You may continue to have sex unless your health care provider tells you not to. Relieving pain and discomfort  Wear a good support bra to relieve breast tenderness.  Take warm sitz baths to soothe any pain or discomfort caused by hemorrhoids. Use hemorrhoid cream if your health care provider approves.  Rest with your legs elevated if you have leg cramps or low back pain.  If you develop varicose veins in   your legs, wear support hose. Elevate your feet for 15 minutes, 3-4 times a day. Limit salt in your diet. Prenatal care  Schedule your prenatal visits by the twelfth week of pregnancy. They are usually scheduled monthly at first, then more often in the last 2 months before delivery.  Write down your questions. Take them to your prenatal visits.  Keep all your prenatal visits as told by your health care provider. This is important. Safety  Wear your seat belt at all times when driving.  Make a list of emergency phone numbers, including numbers for family, friends, the hospital, and police and fire departments. General instructions  Ask your health care provider for a referral to a local prenatal education class. Begin classes no later than the beginning of month 6 of your pregnancy.  Ask for help if you have counseling or nutritional needs during pregnancy. Your health care provider can offer advice or refer you to specialists for help  with various needs.  Do not use hot tubs, steam rooms, or saunas.  Do not douche or use tampons or scented sanitary pads.  Do not cross your legs for long periods of time.  Avoid cat litter boxes and soil used by cats. These carry germs that can cause birth defects in the baby and possibly loss of the fetus by miscarriage or stillbirth.  Avoid all smoking, herbs, alcohol, and medicines not prescribed by your health care provider. Chemicals in these products affect the formation and growth of the baby.  Do not use any products that contain nicotine or tobacco, such as cigarettes and e-cigarettes. If you need help quitting, ask your health care provider. You may receive counseling support and other resources to help you quit.  Schedule a dentist appointment. At home, brush your teeth with a soft toothbrush and be gentle when you floss. Contact a health care provider if:  You have dizziness.  You have mild pelvic cramps, pelvic pressure, or nagging pain in the abdominal area.  You have persistent nausea, vomiting, or diarrhea.  You have a bad smelling vaginal discharge.  You have pain when you urinate.  You notice increased swelling in your face, hands, legs, or ankles.  You are exposed to fifth disease or chickenpox.  You are exposed to German measles (rubella) and have never had it. Get help right away if:  You have a fever.  You are leaking fluid from your vagina.  You have spotting or bleeding from your vagina.  You have severe abdominal cramping or pain.  You have rapid weight gain or loss.  You vomit blood or material that looks like coffee grounds.  You develop a severe headache.  You have shortness of breath.  You have any kind of trauma, such as from a fall or a car accident. Summary  The first trimester of pregnancy is from week 1 until the end of week 13 (months 1 through 3).  Your body goes through many changes during pregnancy. The changes vary from  woman to woman.  You will have routine prenatal visits. During those visits, your health care provider will examine you, discuss any test results you may have, and talk with you about how you are feeling. This information is not intended to replace advice given to you by your health care provider. Make sure you discuss any questions you have with your health care provider. Document Revised: 09/08/2017 Document Reviewed: 09/07/2016 Elsevier Patient Education  2020 Elsevier Inc.  

## 2020-07-21 NOTE — Progress Notes (Signed)
Pt has had occasional spotting

## 2020-07-24 ENCOUNTER — Other Ambulatory Visit: Payer: Self-pay

## 2020-07-24 ENCOUNTER — Ambulatory Visit (INDEPENDENT_AMBULATORY_CARE_PROVIDER_SITE_OTHER): Payer: Medicaid Other

## 2020-07-24 DIAGNOSIS — N83202 Unspecified ovarian cyst, left side: Secondary | ICD-10-CM

## 2020-07-24 DIAGNOSIS — Z3A13 13 weeks gestation of pregnancy: Secondary | ICD-10-CM

## 2020-07-24 DIAGNOSIS — O26851 Spotting complicating pregnancy, first trimester: Secondary | ICD-10-CM

## 2020-08-18 ENCOUNTER — Encounter: Payer: Medicaid Other | Admitting: Advanced Practice Midwife

## 2020-08-18 DIAGNOSIS — Z3A16 16 weeks gestation of pregnancy: Secondary | ICD-10-CM

## 2020-08-21 ENCOUNTER — Ambulatory Visit (INDEPENDENT_AMBULATORY_CARE_PROVIDER_SITE_OTHER): Payer: Medicaid Other | Admitting: Certified Nurse Midwife

## 2020-08-21 ENCOUNTER — Other Ambulatory Visit: Payer: Self-pay

## 2020-08-21 VITALS — BP 108/65 | HR 93 | Wt 196.0 lb

## 2020-08-21 DIAGNOSIS — M7918 Myalgia, other site: Secondary | ICD-10-CM

## 2020-08-21 DIAGNOSIS — Z3480 Encounter for supervision of other normal pregnancy, unspecified trimester: Secondary | ICD-10-CM

## 2020-08-21 NOTE — Progress Notes (Signed)
Subjective:  Anna [name] is a 24 y.o. G2P1001 at 106w3d being seen today for ongoing prenatal care.  She is currently monitored for the following issues for this low-risk pregnancy and has Left ovarian cyst and Supervision of other normal pregnancy, antepartum on their problem list.  Patient reports left upper abd pain under ribs. Happens when she is sitting for long periods of time or slouched..  Contractions: Not present. Vag. Bleeding: None.  Movement: Absent. Denies leaking of fluid.   The following portions of the patient's history were reviewed and updated as appropriate: allergies, current medications, past family history, past medical history, past social history, past surgical history and problem list. Problem list updated.  Objective:   Vitals:   08/21/20 1036  BP: 108/65  Pulse: 93  Weight: 196 lb (88.9 kg)    Fetal Status: Fetal Heart Rate (bpm): 157 Fundal Height: 16 cm Movement: Absent     General:  Alert, oriented and cooperative. Patient is in no acute distress.  Skin: Skin is warm and dry. No rash noted.   Cardiovascular: Normal heart rate noted  Respiratory: Normal respiratory effort, no problems with respiration noted  Abdomen: Soft, gravid, appropriate for gestational age. Pain/Pressure: Absent     Pelvic: Vag. Bleeding: None Vag D/C Character: Thin   Cervical exam deferred        Extremities: Normal range of motion.  Edema: None  Mental Status: Normal mood and affect. Normal behavior. Normal judgment and thought content.   Urinalysis:      Assessment and Plan:  Pregnancy: G2P1001 at [redacted]w[redacted]d  1. Supervision of other normal pregnancy, antepartum - [place] MFM OB DETAIL +14 WK; Future  2. Muscular abdominal pain, LUQ - likely d/t position - recommend stretches, heat, Tylenol  Preterm labor symptoms and general obstetric precautions including but not limited to vaginal bleeding, contractions, leaking of fluid and fetal movement were reviewed in detail with the  patient. Please refer to After Visit Summary for other counseling recommendations.  Return in about 4 weeks (around 09/18/2020).   Donette Larry, CNM

## 2020-09-09 ENCOUNTER — Ambulatory Visit: Payer: Medicaid Other | Attending: Certified Nurse Midwife

## 2020-09-09 ENCOUNTER — Other Ambulatory Visit: Payer: Self-pay

## 2020-09-09 ENCOUNTER — Other Ambulatory Visit: Payer: Self-pay | Admitting: Certified Nurse Midwife

## 2020-09-09 DIAGNOSIS — O4440 Low lying placenta NOS or without hemorrhage, unspecified trimester: Secondary | ICD-10-CM | POA: Insufficient documentation

## 2020-09-09 DIAGNOSIS — Z3480 Encounter for supervision of other normal pregnancy, unspecified trimester: Secondary | ICD-10-CM | POA: Diagnosis present

## 2020-09-10 ENCOUNTER — Other Ambulatory Visit: Payer: Self-pay | Admitting: *Deleted

## 2020-09-10 DIAGNOSIS — Z3689 Encounter for other specified antenatal screening: Secondary | ICD-10-CM

## 2020-09-11 DIAGNOSIS — O4440 Low lying placenta NOS or without hemorrhage, unspecified trimester: Secondary | ICD-10-CM | POA: Insufficient documentation

## 2020-09-18 ENCOUNTER — Telehealth: Payer: Medicaid Other | Admitting: Obstetrics and Gynecology

## 2020-09-22 ENCOUNTER — Encounter: Payer: Self-pay | Admitting: Certified Nurse Midwife

## 2020-09-22 ENCOUNTER — Other Ambulatory Visit: Payer: Self-pay

## 2020-09-22 ENCOUNTER — Ambulatory Visit (INDEPENDENT_AMBULATORY_CARE_PROVIDER_SITE_OTHER): Payer: Medicaid Other | Admitting: Certified Nurse Midwife

## 2020-09-22 VITALS — BP 107/63 | HR 95 | Wt 199.0 lb

## 2020-09-22 DIAGNOSIS — Z3480 Encounter for supervision of other normal pregnancy, unspecified trimester: Secondary | ICD-10-CM

## 2020-09-22 DIAGNOSIS — Z3A21 21 weeks gestation of pregnancy: Secondary | ICD-10-CM

## 2020-09-22 DIAGNOSIS — O4440 Low lying placenta NOS or without hemorrhage, unspecified trimester: Secondary | ICD-10-CM

## 2020-09-22 NOTE — Progress Notes (Signed)
   PRENATAL VISIT NOTE  Subjective:  Anna [name] is a 24 y.o. G2P1001 at [redacted]w[redacted]d being seen today for ongoing prenatal care.  She is currently monitored for the following issues for this low-risk pregnancy and has Left ovarian cyst; Supervision of other normal pregnancy, antepartum; and Low lying placenta, antepartum on their problem list.  Patient reports no complaints.  Contractions: Not present. Vag. Bleeding: None.  Movement: Present. Denies leaking of fluid.   The following portions of the patient's history were reviewed and updated as appropriate: allergies, current medications, past family history, past medical history, past social history, past surgical history and problem list.   Objective:   Vitals:   09/22/20 1447  BP: 107/63  Pulse: 95  Weight: 199 lb (90.3 kg)    Fetal Status: Fetal Heart Rate (bpm): 145 Fundal Height: 19 cm Movement: Present     General:  Alert, oriented and cooperative. Patient is in no acute distress.  Skin: Skin is warm and dry. No rash noted.   Cardiovascular: Normal heart rate noted  Respiratory: Normal respiratory effort, no problems with respiration noted  Abdomen: Soft, gravid, appropriate for gestational age.  Pain/Pressure: Absent     Pelvic: Cervical exam deferred        Extremities: Normal range of motion.  Edema: None  Mental Status: Normal mood and affect. Normal behavior. Normal judgment and thought content.   Assessment and Plan:  Pregnancy: G2P1001 at [redacted]w[redacted]d 1. [redacted] weeks gestation of pregnancy  2. Supervision of other normal pregnancy, antepartum - patient doing well - routine prenatal care - anticipatory guidance on upcoming appointments  - educated and discussed GTT around 28 weeks   3. Low lying placenta, antepartum - follow up US scheduled for 1/3 to reassess placental location  - warning signs with bleeding and reasons to present to MAU discussed   Preterm labor symptoms and general obstetric precautions including but not  limited to vaginal bleeding, contractions, leaking of fluid and fetal movement were reviewed in detail with the patient. Please refer to After Visit Summary for other counseling recommendations.   Return in about 4 weeks (around 10/20/2020) for LROB, in person.  Future Appointments  Date Time Provider Department Center  10/12/2020  2:30 PM [facility] NURSE [facility]  10/12/2020  2:45 PM [facility]  10/20/2020  9:30 AM Sharyon Cable, CNM [facility]    Sharyon Cable, CNM

## 2020-09-22 NOTE — Patient Instructions (Signed)
Glucose Tolerance Test During Pregnancy Why am I having this test? The glucose tolerance test (GTT) is done to check how your body processes sugar (glucose). This is one of several tests used to diagnose diabetes that develops during pregnancy (gestational diabetes mellitus). Gestational diabetes is a temporary form of diabetes that some women develop during pregnancy. It usually occurs during the second trimester of pregnancy and goes away after delivery. Testing (screening) for gestational diabetes usually occurs between 24 and 28 weeks of pregnancy. You may have the GTT test after having a 1-hour glucose screening test if the results from that test indicate that you may have gestational diabetes. You may also have this test if:  You have a history of gestational diabetes.  You have a history of giving birth to very large babies or have experienced repeated fetal loss (stillbirth).  You have signs and symptoms of diabetes, such as: ? Changes in your vision. ? Tingling or numbness in your hands or feet. ? Changes in hunger, thirst, and urination that are not otherwise explained by your pregnancy. What is being tested? This test measures the amount of glucose in your blood at different times during a period of 3 hours. This indicates how well your body is able to process glucose. What kind of sample is taken?  Blood samples are required for this test. They are usually collected by inserting a needle into a blood vessel. How do I prepare for this test?  For 3 days before your test, eat normally. Have plenty of carbohydrate-rich foods.  Follow instructions from your health care provider about: ? Eating or drinking restrictions on the day of the test. You may be asked to not eat or drink anything other than water (fast) starting 8-10 hours before the test. ? Changing or stopping your regular medicines. Some medicines may interfere with this test. Tell a health care provider about:  All  medicines you are taking, including vitamins, herbs, eye drops, creams, and over-the-counter medicines.  Any blood disorders you have.  Any surgeries you have had.  Any medical conditions you have. What happens during the test? First, your blood glucose will be measured. This is referred to as your fasting blood glucose, since you fasted before the test. Then, you will drink a glucose solution that contains a certain amount of glucose. Your blood glucose will be measured again 1, 2, and 3 hours after drinking the solution. This test takes about 3 hours to complete. You will need to stay at the testing location during this time. During the testing period:  Do not eat or drink anything other than the glucose solution.  Do not exercise.  Do not use any products that contain nicotine or tobacco, such as cigarettes and e-cigarettes. If you need help stopping, ask your health care provider. The testing procedure may vary among health care providers and hospitals. How are the results reported? Your results will be reported as milligrams of glucose per deciliter of blood (mg/dL) or millimoles per liter (mmol/L). Your health care provider will compare your results to normal ranges that were established after testing a large group of people (reference ranges). Reference ranges may vary among labs and hospitals. For this test, common reference ranges are:  Fasting: less than 95-105 mg/dL (5.3-5.8 mmol/L).  1 hour after drinking glucose: less than 180-190 mg/dL (10.0-10.5 mmol/L).  2 hours after drinking glucose: less than 155-165 mg/dL (8.6-9.2 mmol/L).  3 hours after drinking glucose: 140-145 mg/dL (7.8-8.1 mmol/L). What do the   results mean? Results within reference ranges are considered normal, meaning that your glucose levels are well-controlled. If two or more of your blood glucose levels are high, you may be diagnosed with gestational diabetes. If only one level is high, your health care  provider may suggest repeat testing or other tests to confirm a diagnosis. Talk with your health care provider about what your results mean. Questions to ask your health care provider Ask your health care provider, or the department that is doing the test:  When will my results be ready?  How will I get my results?  What are my treatment options?  What other tests do I need?  What are my next steps? Summary  The glucose tolerance test (GTT) is one of several tests used to diagnose diabetes that develops during pregnancy (gestational diabetes mellitus). Gestational diabetes is a temporary form of diabetes that some women develop during pregnancy.  You may have the GTT test after having a 1-hour glucose screening test if the results from that test indicate that you may have gestational diabetes. You may also have this test if you have any symptoms or risk factors for gestational diabetes.  Talk with your health care provider about what your results mean. This information is not intended to replace advice given to you by your health care provider. Make sure you discuss any questions you have with your health care provider. Document Revised: 01/17/2019 Document Reviewed: 05/08/2017 Elsevier Patient Education  2020 Else[employer] Inc. Round Ligament Pain  The round ligament is a cord of muscle and tissue that helps support the uterus. It can become a source of pain during pregnancy if it becomes stretched or twisted as the baby grows. The pain usually begins in the second trimester (13-28 weeks) of pregnancy, and it can come and go until the baby is delivered. It is not a serious problem, and it does not cause harm to the baby. Round ligament pain is usually a short, sharp, and pinching pain, but it can also be a dull, lingering, and aching pain. The pain is felt in the lower side of the abdomen or in the groin. It usually starts deep in the groin and moves up to the outside of the hip area. The  pain may occur when you:  Suddenly change position, such as quickly going from a sitting to standing position.  Roll over in bed.  Cough or sneeze.  Do physical activity. Follow these instructions at home:   Watch your condition for any changes.  When the pain starts, relax. Then try any of these methods to help with the pain: ? Sitting down. ? Flexing your knees up to your abdomen. ? Lying on your side with one pillow under your abdomen and another pillow between your legs. ? Sitting in a warm bath for 15-20 minutes or until the pain goes away.  Take over-the-counter and prescription medicines only as told by your health care provider.  Move slowly when you sit down or stand up.  Avoid long walks if they cause pain.  Stop or reduce your physical activities if they cause pain.  Keep all follow-up visits as told by your health care provider. This is important. Contact a health care provider if:  Your pain does not go away with treatment.  You feel pain in your back that you did not have before.  Your medicine is not helping. Get help right away if:  You have a fever or chills.  You develop uterine   contractions.  You have vaginal bleeding.  You have nausea or vomiting.  You have diarrhea.  You have pain when you urinate. Summary  Round ligament pain is felt in the lower abdomen or groin. It is usually a short, sharp, and pinching pain. It can also be a dull, lingering, and aching pain.  This pain usually begins in the second trimester (13-28 weeks). It occurs because the uterus is stretching with the growing baby, and it is not harmful to the baby.  You may notice the pain when you suddenly change position, when you cough or sneeze, or during physical activity.  Relaxing, flexing your knees to your abdomen, lying on one side, or taking a warm bath may help to get rid of the pain.  Get help from your health care provider if the pain does not go away or if you  have vaginal bleeding, nausea, vomiting, diarrhea, or painful urination. This information is not intended to replace advice given to you by your health care provider. Make sure you discuss any questions you have with your health care provider. Document Revised: 03/14/2018 Document Reviewed: 03/14/2018 Elsevier Patient Education  2020 Elsevier Inc.  

## 2020-10-10 NOTE — L&D Delivery Note (Signed)
LABOR COURSE Patient was admitted for SROM at 2200 hours on 01/20/2021. She was augmented with Pitocin.  Delivery Note Called to room and patient was complete and pushing. Head delivered . Loose nuchal x 1, delivered through. Shoulder and body delivered in usual fashion. At 1901 a viable female was delivered via Vaginal,Spontaneous (Presentation: ROT; ROA).  Infant with spontaneous cry, placed on mother's abdomen, dried and stimulated. Cord clamped x 2 after two-minute delay, and cut by FOB. Cord blood drawn. Placenta delivered spontaneously with gentle cord traction. Appears intact. Fundus firm with massage and Pitocin. Labia, perineum, vagina, and cervix inspected.    APGAR:9, 9; weight: 9lb 0.4 oz.    Anesthesia:  Epidural Episiotomy: None Lacerations: 2nd degree perineal, hemostatic. Repair indicated by degree and location of lac. Suture Repair: 3.0 vicryl Est. Blood Loss (mL): 100  Mom to postpartum.  Baby to Couplet care / Skin to Skin.  Clayton Bibles, CNM 01/21/21 7:26 PM

## 2020-10-12 ENCOUNTER — Ambulatory Visit: Payer: Medicaid Other

## 2020-10-20 ENCOUNTER — Encounter: Payer: Self-pay | Admitting: Certified Nurse Midwife

## 2020-10-20 ENCOUNTER — Other Ambulatory Visit: Payer: Self-pay

## 2020-10-20 ENCOUNTER — Ambulatory Visit (INDEPENDENT_AMBULATORY_CARE_PROVIDER_SITE_OTHER): Payer: Medicaid Other | Admitting: Certified Nurse Midwife

## 2020-10-20 VITALS — BP 130/70 | HR 107 | Wt 203.0 lb

## 2020-10-20 DIAGNOSIS — O4440 Low lying placenta NOS or without hemorrhage, unspecified trimester: Secondary | ICD-10-CM

## 2020-10-20 DIAGNOSIS — Z3480 Encounter for supervision of other normal pregnancy, unspecified trimester: Secondary | ICD-10-CM

## 2020-10-20 DIAGNOSIS — Z3A25 25 weeks gestation of pregnancy: Secondary | ICD-10-CM

## 2020-10-20 NOTE — Patient Instructions (Signed)
Oral Glucose Tolerance Test During Pregnancy Why am I having this test? The oral glucose tolerance test (OGTT) is done to check how your body processes blood sugar (glucose). This is one of several tests used to diagnose diabetes that develops during pregnancy (gestational diabetes mellitus). Gestational diabetes is a short-term form of diabetes that some women develop while they are pregnant. It usually occurs during the second trimester of pregnancy and goes away after delivery. Testing, or screening, for gestational diabetes usually occurs at weeks 24-28 of pregnancy. You may have the OGTT test after having a 1-hour glucose screening test if the results from that test indicate that you may have gestational diabetes. This test may also be needed if:  You have a history of gestational diabetes.  There is a history of giving birth to very large babies or of losing pregnancies (having stillbirths).  You have signs and symptoms of diabetes, such as: ? Changes in your eyesight. ? Tingling or numbness in your hands or feet. ? Changes in hunger, thirst, and urination, and these are not explained by your pregnancy. What is being tested? This test measures the amount of glucose in your blood at different times during a period of 3 hours. This shows how well your body can process glucose. What kind of sample is taken? Blood samples are required for this test. They are usually collected by inserting a needle into a blood vessel.   How do I prepare for this test?  For 3 days before your test, eat normally. Have plenty of carbohydrate-rich foods.  Follow instructions from your health care provider about: ? Eating or drinking restrictions on the day of the test. You may be asked not to eat or drink anything other than water (to fast) starting 8-10 hours before the test. ? Changing or stopping your regular medicines. Some medicines may interfere with this test. Tell a health care provider about:  All  medicines you are taking, including vitamins, herbs, eye drops, creams, and over-the-counter medicines.  Any blood disorders you have.  Any surgeries you have had.  Any medical conditions you have. What happens during the test? First, your blood glucose will be measured. This is referred to as your fasting blood glucose because you fasted before the test. Then, you will drink a glucose solution that contains a certain amount of glucose. Your blood glucose will be measured again 1, 2, and 3 hours after you drink the solution. This test takes about 3 hours to complete. You will need to stay at the testing location during this time. During the testing period:  Do not eat or drink anything other than the glucose solution.  Do not exercise.  Do not use any products that contain nicotine or tobacco, such as cigarettes, e-cigarettes, and chewing tobacco. These can affect your test results. If you need help quitting, ask your health care provider. The testing procedure may vary among health care providers and hospitals. How are the results reported? Your results will be reported as milligrams of glucose per deciliter of blood (mg/dL) or millimoles per liter (mmol/L). There is more than one source for screening and diagnosis reference values used to diagnose gestational diabetes. Your health care provider will compare your results to normal values that were established after testing a large group of people (reference values). Reference values may vary among labs and hospitals. For this test (Carpenter-Coustan), reference values are:  Fasting: 95 mg/dL (5.3 mmol/L).  1 hour: 180 mg/dL (10.0 mmol/L).  2 hour:   155 mg/dL (8.6 mmol/L).  3 hour: 140 mg/dL (7.8 mmol/L). What do the results mean? Results below the reference values are considered normal. If two or more of your blood glucose levels are at or above the reference values, you may be diagnosed with gestational diabetes. If only one level is  high, your health care provider may suggest repeat testing or other tests to confirm a diagnosis. Talk with your health care provider about what your results mean. Questions to ask your health care provider Ask your health care provider, or the department that is doing the test:  When will my results be ready?  How will I get my results?  What are my treatment options?  What other tests do I need?  What are my next steps? Summary  The oral glucose tolerance test (OGTT) is one of several tests used to diagnose diabetes that develops during pregnancy (gestational diabetes mellitus). Gestational diabetes is a short-term form of diabetes that some women develop while they are pregnant.  You may have the OGTT test after having a 1-hour glucose screening test if the results from that test show that you may have gestational diabetes. You may also have this test if you have any symptoms or risk factors for this type of diabetes.  Talk with your health care provider about what your results mean. This information is not intended to replace advice given to you by your health care provider. Make sure you discuss any questions you have with your health care provider. Document Revised: 03/05/2020 Document Reviewed: 03/05/2020 Elsevier Patient Education  2021 Elsevier Inc.  

## 2020-10-20 NOTE — Progress Notes (Signed)
   PRENATAL VISIT NOTE  Subjective:  Anna [name] is a 25 y.o. G2P1001 at [redacted]w[redacted]d being seen today for ongoing prenatal care.  She is currently monitored for the following issues for this low-risk pregnancy and has Left ovarian cyst; Supervision of other normal pregnancy, antepartum; and Low lying placenta, antepartum on their problem list.  Patient reports no complaints.  Contractions: Not present. Vag. Bleeding: None.  Movement: Present. Denies leaking of fluid.   The following portions of the patient's history were reviewed and updated as appropriate: allergies, current medications, past family history, past medical history, past social history, past surgical history and problem list.   Objective:   Vitals:   10/20/20 0922  BP: 130/70  Pulse: (!) 107  Weight: 203 lb (92.1 kg)    Fetal Status: Fetal Heart Rate (bpm): 143 Fundal Height: 25 cm Movement: Present     General:  Alert, oriented and cooperative. Patient is in no acute distress.  Skin: Skin is warm and dry. No rash noted.   Cardiovascular: Normal heart rate noted  Respiratory: Normal respiratory effort, no problems with respiration noted  Abdomen: Soft, gravid, appropriate for gestational age.  Pain/Pressure: Absent     Pelvic: Cervical exam deferred        Extremities: Normal range of motion.  Edema: None  Mental Status: Normal mood and affect. Normal behavior. Normal judgment and thought content.   Assessment and Plan:  Pregnancy: G2P1001 at [redacted]w[redacted]d 1. Supervision of other normal pregnancy, antepartum - Patient doing well, reports one day of dizziness last week, denies currently happening  - Discussed with patient occurrence of dizziness throughout the day last week, patient reports taking her BP which was normal and eating without resolution. Patient reports dizziness went away on its own after sleep  - Discussed with patient iron deficiency anemia vs vertigo as cause for dizziness, recommended CBC to assess for anemia -  patient desires to wait until next appointment when blood work will already occur  - Routine prenatal care - Anticipatory guidance on upcoming appointments including GTT at next visit, discussed with patient to present to appointment fasting after MN  - Precautions and reasons to present to MAU discussed   2. Low lying placenta, antepartum - Follow up US on 1/17  3. [redacted] weeks gestation of pregnancy  Preterm labor symptoms and general obstetric precautions including but not limited to vaginal bleeding, contractions, leaking of fluid and fetal movement were reviewed in detail with the patient. Please refer to After Visit Summary for other counseling recommendations.   Return in about 4 weeks (around 11/17/2020) for LROB, GTT, in person.  Future Appointments  Date Time Provider Department Center  10/26/2020  2:30 PM [facility] NUR[facility]  10/26/2020  2:45 PM [facility]  11/17/2020  8:30 AM Sharyon Cable, CNM [facility]    Sharyon Cable, CNM

## 2020-10-20 NOTE — Progress Notes (Signed)
Pt states she had one day last week where she felt dizzy all day

## 2020-10-26 ENCOUNTER — Ambulatory Visit: Payer: Medicaid Other

## 2020-10-27 ENCOUNTER — Ambulatory Visit (HOSPITAL_BASED_OUTPATIENT_CLINIC_OR_DEPARTMENT_OTHER): Payer: Medicaid Other

## 2020-10-27 ENCOUNTER — Encounter: Payer: Self-pay | Admitting: *Deleted

## 2020-10-27 ENCOUNTER — Other Ambulatory Visit: Payer: Self-pay

## 2020-10-27 ENCOUNTER — Ambulatory Visit: Payer: Medicaid Other

## 2020-10-27 ENCOUNTER — Ambulatory Visit: Payer: Medicaid Other | Attending: Obstetrics and Gynecology | Admitting: *Deleted

## 2020-10-27 DIAGNOSIS — O4442 Low lying placenta NOS or without hemorrhage, second trimester: Secondary | ICD-10-CM

## 2020-10-27 DIAGNOSIS — O3482 Maternal care for other abnormalities of pelvic organs, second trimester: Secondary | ICD-10-CM | POA: Insufficient documentation

## 2020-10-27 DIAGNOSIS — O99212 Obesity complicating pregnancy, second trimester: Secondary | ICD-10-CM

## 2020-10-27 DIAGNOSIS — O4440 Low lying placenta NOS or without hemorrhage, unspecified trimester: Secondary | ICD-10-CM | POA: Diagnosis not present

## 2020-10-27 DIAGNOSIS — E669 Obesity, unspecified: Secondary | ICD-10-CM | POA: Insufficient documentation

## 2020-10-27 DIAGNOSIS — Z3A26 26 weeks gestation of pregnancy: Secondary | ICD-10-CM | POA: Insufficient documentation

## 2020-10-27 DIAGNOSIS — Z3480 Encounter for supervision of other normal pregnancy, unspecified trimester: Secondary | ICD-10-CM

## 2020-10-27 DIAGNOSIS — Z3689 Encounter for other specified antenatal screening: Secondary | ICD-10-CM

## 2020-10-27 DIAGNOSIS — N83202 Unspecified ovarian cyst, left side: Secondary | ICD-10-CM | POA: Diagnosis not present

## 2020-10-27 DIAGNOSIS — Z362 Encounter for other antenatal screening follow-up: Secondary | ICD-10-CM

## 2020-11-05 ENCOUNTER — Encounter: Payer: Self-pay | Admitting: *Deleted

## 2020-11-17 ENCOUNTER — Encounter: Payer: Medicaid Other | Admitting: Certified Nurse Midwife

## 2020-11-27 ENCOUNTER — Encounter: Payer: Medicaid Other | Admitting: Certified Nurse Midwife

## 2020-12-04 ENCOUNTER — Ambulatory Visit (INDEPENDENT_AMBULATORY_CARE_PROVIDER_SITE_OTHER): Payer: Medicaid Other | Admitting: Certified Nurse Midwife

## 2020-12-04 ENCOUNTER — Other Ambulatory Visit: Payer: Self-pay

## 2020-12-04 VITALS — BP 128/71 | HR 96 | Wt 207.0 lb

## 2020-12-04 DIAGNOSIS — O4440 Low lying placenta NOS or without hemorrhage, unspecified trimester: Secondary | ICD-10-CM

## 2020-12-04 DIAGNOSIS — Z3480 Encounter for supervision of other normal pregnancy, unspecified trimester: Secondary | ICD-10-CM | POA: Diagnosis not present

## 2020-12-04 DIAGNOSIS — Z3A31 31 weeks gestation of pregnancy: Secondary | ICD-10-CM

## 2020-12-04 LAB — POCT URINALYSIS DIPSTICK
Bilirubin, UA: NEGATIVE
Blood, UA: NEGATIVE
Glucose, UA: NEGATIVE
Ketones, UA: NEGATIVE
Leukocytes, UA: NEGATIVE
Nitrite, UA: NEGATIVE
Protein, UA: NEGATIVE
Spec Grav, UA: 1.025 (ref 1.010–1.025)
Urobilinogen, UA: 0.2 E.U./dL
pH, UA: 5 (ref 5.0–8.0)

## 2020-12-04 NOTE — Progress Notes (Signed)
Declined Tdap  

## 2020-12-04 NOTE — Progress Notes (Signed)
Pt concerned about urinating on herself when she sits down Undecided about Tdap- info given

## 2020-12-04 NOTE — Progress Notes (Addendum)
Subjective:  Anna [name] is a 25 y.o. G2P1001 at [redacted]w[redacted]d being seen today for ongoing prenatal care.  She is currently monitored for the following issues for this low-risk pregnancy and has Left ovarian cyst; Supervision of other normal pregnancy, antepartum; and Low lying placenta, antepartum on their problem list.  Patient reports reports 1 week of leaking urine when she sits down. Reports urgency and denies other urinary sx.  Contractions: Not present. Vag. Bleeding: None.  Movement: Present. Denies leaking of fluid.   The following portions of the patient's history were reviewed and updated as appropriate: allergies, current medications, past family history, past medical history, past social history, past surgical history and problem list. Problem list updated.  Objective:   Vitals:   12/04/20 0849  BP: 128/71  Pulse: 96  Weight: 207 lb (93.9 kg)    Fetal Status: Fetal Heart Rate (bpm): 141 Fundal Height: 31 cm Movement: Present  Presentation: Undeterminable  General:  Alert, oriented and cooperative. Patient is in no acute distress.  Skin: Skin is warm and dry. No rash noted.   Cardiovascular: Normal heart rate noted  Respiratory: Normal respiratory effort, no problems with respiration noted  Abdomen: Soft, gravid, appropriate for gestational age. Pain/Pressure: Absent     Pelvic: Vag. Bleeding: None Vag D/C Character: Thin   Cervical exam deferred        Extremities: Normal range of motion.  Edema: None  Mental Status: Normal mood and affect. Normal behavior. Normal judgment and thought content.   Urinalysis:     Results for orders placed or performed in visit on 12/04/20 (from the past 24 hour(s))  POCT Urinalysis Dipstick     Status: None   Collection Time: 12/04/20  9:12 AM  Result Value Ref Range   Color, UA     Clarity, UA     Glucose, UA Negative Negative   Bilirubin, UA neg    Ketones, UA neg    Spec Grav, UA 1.025 1.010 - 1.025   Blood, UA neg    pH, UA 5.0 5.0 -  8.0   Protein, UA Negative Negative   Urobilinogen, UA 0.2 0.2 or 1.0 E.U./dL   Nitrite, UA neg    Leukocytes, UA Negative Negative   Appearance     Odor     Assessment and Plan:  Pregnancy: G2P1001 at [redacted]w[redacted]d  1. Supervision of other normal pregnancy, antepartum - 2Hr GTT w/ 1 Hr Carpenter 75 g - HIV antibody (with reflex) - CBC - RPR  2. Low lying placenta, antepartum - resolved  3. Urinary incontinence - UA and UC  4. [redacted] weeks gestation   Preterm labor symptoms and general obstetric precautions including but not limited to vaginal bleeding, contractions, leaking of fluid and fetal movement were reviewed in detail with the patient. Please refer to After Visit Summary for other counseling recommendations.  Return in about 3 weeks (around 12/25/2020).   Donette Larry, CNM

## 2020-12-06 ENCOUNTER — Encounter: Payer: Self-pay | Admitting: Certified Nurse Midwife

## 2020-12-06 ENCOUNTER — Other Ambulatory Visit: Payer: Self-pay | Admitting: Certified Nurse Midwife

## 2020-12-06 DIAGNOSIS — O99019 Anemia complicating pregnancy, unspecified trimester: Secondary | ICD-10-CM | POA: Insufficient documentation

## 2020-12-06 DIAGNOSIS — O99013 Anemia complicating pregnancy, third trimester: Secondary | ICD-10-CM

## 2020-12-06 MED ORDER — POLYSACCHARIDE IRON COMPLEX 150 MG PO CAPS: 150.0000 mg | ORAL_CAPSULE | Freq: Every day | ORAL | 1 refills | Status: DC

## 2020-12-06 NOTE — Progress Notes (Signed)
Notified via My Chart

## 2020-12-07 LAB — CBC
HCT: 29.9 % — ABNORMAL LOW (ref 35.0–45.0)
Hemoglobin: 10 g/dL — ABNORMAL LOW (ref 11.7–15.5)
MCH: 28.5 pg (ref 27.0–33.0)
MCHC: 33.4 g/dL (ref 32.0–36.0)
MCV: 85.2 fL (ref 80.0–100.0)
MPV: 11 fL (ref 7.5–12.5)
Platelets: 240 10*3/uL (ref 140–400)
RBC: 3.51 10*6/uL — ABNORMAL LOW (ref 3.80–5.10)
RDW: 13.3 % (ref 11.0–15.0)
WBC: 14.4 10*3/uL — ABNORMAL HIGH (ref 3.8–10.8)

## 2020-12-07 LAB — 2HR GTT W 1 HR, CARPENTER, 75 G
Glucose, 1 Hr, Gest: 168 mg/dL (ref 65–179)
Glucose, 2 Hr, Gest: 151 mg/dL (ref 65–152)
Glucose, Fasting, Gest: 82 mg/dL (ref 65–91)

## 2020-12-07 LAB — HIV ANTIBODY (ROUTINE TESTING W REFLEX): HIV 1&2 Ab, 4th Generation: NONREACTIVE

## 2020-12-07 LAB — RPR: RPR Ser Ql: NONREACTIVE

## 2020-12-09 LAB — URINE CULTURE, OB REFLEX

## 2020-12-09 LAB — CULTURE, OB URINE

## 2020-12-25 ENCOUNTER — Other Ambulatory Visit: Payer: Self-pay

## 2020-12-25 ENCOUNTER — Ambulatory Visit (INDEPENDENT_AMBULATORY_CARE_PROVIDER_SITE_OTHER): Payer: Medicaid Other | Admitting: Certified Nurse Midwife

## 2020-12-25 VITALS — BP 119/65 | HR 100 | Wt 211.0 lb

## 2020-12-25 DIAGNOSIS — Z3A34 34 weeks gestation of pregnancy: Secondary | ICD-10-CM

## 2020-12-25 DIAGNOSIS — Z3480 Encounter for supervision of other normal pregnancy, unspecified trimester: Secondary | ICD-10-CM

## 2020-12-25 DIAGNOSIS — O99013 Anemia complicating pregnancy, third trimester: Secondary | ICD-10-CM

## 2020-12-25 MED ORDER — POLYSACCHARIDE IRON COMPLEX 150 MG PO CAPS: 150.0000 mg | ORAL_CAPSULE | Freq: Every day | ORAL | 1 refills | Status: DC

## 2020-12-25 NOTE — Progress Notes (Signed)
Subjective:  Anna [name] is a 25 y.o. G2P1001 at [redacted]w[redacted]d being seen today for ongoing prenatal care.  She is currently monitored for the following issues for this low-risk pregnancy and has Left ovarian cyst; Supervision of other normal pregnancy, antepartum; Low lying placenta, antepartum; and Anemia of pregnancy on their problem list.  Patient reports no complaints.  Contractions: Irritability. Vag. Bleeding: None.  Movement: Present. Denies leaking of fluid.   The following portions of the patient's history were reviewed and updated as appropriate: allergies, current medications, past family history, past medical history, past social history, past surgical history and problem list. Problem list updated.  Objective:   Vitals:   12/25/20 0932  BP: 119/65  Pulse: 100  Weight: 211 lb (95.7 kg)    Fetal Status: Fetal Heart Rate (bpm): 150 Fundal Height: 35 cm Movement: Present  Presentation: Vertex  General:  Alert, oriented and cooperative. Patient is in no acute distress.  Skin: Skin is warm and dry. No rash noted.   Cardiovascular: Normal heart rate noted  Respiratory: Normal respiratory effort, no problems with respiration noted  Abdomen: Soft, gravid, appropriate for gestational age. Pain/Pressure: Present     Pelvic: Vag. Bleeding: None Vag D/C Character: Thin   Cervical exam deferred        Extremities: Normal range of motion.  Edema: Trace  Mental Status: Normal mood and affect. Normal behavior. Normal judgment and thought content.   Urinalysis:      Assessment and Plan:  Pregnancy: G2P1001 at [redacted]w[redacted]d  1. [redacted] weeks gestation of pregnancy  2. Supervision of other normal pregnancy, antepartum - Culture, OB Urine (last culture +staph, unclear if contaminate)  3. Anemia during pregnancy in third trimester - iron polysaccharides (NIFEREX) 150 MG capsule; Take 1 capsule (150 mg total) by mouth daily.  Dispense: 30 capsule; Refill: 1  Preterm labor symptoms and general obstetric  precautions including but not limited to vaginal bleeding, contractions, leaking of fluid and fetal movement were reviewed in detail with the patient. Please refer to After Visit Summary for other counseling recommendations.  Return in about 2 weeks (around 01/08/2021).   Donette Larry, CNM

## 2020-12-27 LAB — URINE CULTURE, OB REFLEX

## 2020-12-27 LAB — CULTURE, OB URINE

## 2021-01-05 ENCOUNTER — Other Ambulatory Visit (HOSPITAL_COMMUNITY)
Admission: RE | Admit: 2021-01-05 | Discharge: 2021-01-05 | Disposition: A | Discharge status: Home / Self Care | Payer: Medicaid Other | Source: Ambulatory Visit | Attending: Obstetrics and Gynecology | Admitting: Obstetrics and Gynecology

## 2021-01-05 ENCOUNTER — Ambulatory Visit (INDEPENDENT_AMBULATORY_CARE_PROVIDER_SITE_OTHER): Payer: Medicaid Other | Admitting: Obstetrics and Gynecology

## 2021-01-05 ENCOUNTER — Encounter: Payer: Self-pay | Admitting: Obstetrics and Gynecology

## 2021-01-05 ENCOUNTER — Other Ambulatory Visit: Payer: Self-pay

## 2021-01-05 VITALS — BP 124/67 | HR 94 | Wt 213.0 lb

## 2021-01-05 DIAGNOSIS — Z3A36 36 weeks gestation of pregnancy: Secondary | ICD-10-CM

## 2021-01-05 DIAGNOSIS — Z3480 Encounter for supervision of other normal pregnancy, unspecified trimester: Secondary | ICD-10-CM

## 2021-01-05 LAB — OB RESULTS CONSOLE GBS: GBS: NEGATIVE

## 2021-01-05 NOTE — Patient Instructions (Addendum)
The MilesCircuit  This circuit takes at least 90 minutes to complete so clear your schedule and make mental preparations so you can relax in your environment. The second step requires a lot of pillows so gather them up before beginning Before starting, you should empty your bladder! Have a nice drink nearby, and make sure it has a straw! If you are having contractions, this circuit should be done through contractions, try not to change positions between steps Before you begin...  "I named this 'circuit' after my friend Anna [name], who shared and discussed it with me when I was working with a client whose labor seemed to be stalled out and no longer progressing... This circuit is useful to help get the baby lined up, ideally, in the "Left Occiput Anterior" (LOA) Position, both before labor begins and when some corrections need to be done during labor. Prenatally, this position set can help to rotate a baby. As a natural method of induction, this can help get things going if baby just needed a gentle nudge of position to set things off. To the best of my knowledge, this group of positions will not "hurt" a baby that is already lined up correctly." - Sharon Muza   Step One: Open-knee Chest Stay in this position for 30 minutes, start in cat/cow, then drop your chest as low as you can to the bed or the floor and your bottom as high as you can. Knees should be fairly wide apart, and the angle between the torso/thighs should be wider than 90 degrees. Wiggle around, prop with lots of pillows and use this time to get totally relaxed. This position allows the baby to scoot out of the pelvis a bit and gives them room to rotate, shift their head position, etc. If the pregnant person finds it helpful, careful positioning with a rebozo under the belly, with gentle tension from a support person behind can help maintain this position for the full 30 minutes.  Step Two:Exaggerated Left Side  Lying Roll to your left side, bringing your top leg as high as possible and keeping your bottom leg straight. Roll forward as much as possible, again using a lot of pillows. Sink into the bed and relax some more. If you fall asleep, that's totally okay and you can stay there! If not, stay here for at least another half an hour. Try and get your top right leg up towards your head and get as rolled over onto your belly as much as possible. If you repeat the circuit during labor, try alternating left and right sides. We know the photo the left is actually right side... just flip the image in your head.  Step Three: Moving and Lunges Lunge, walk stairs facing sideways, 2 at a time, (have a spotter downstairs of you!), take a walk outside with one foot on the curb and the other on the street, sit on a birth ball and hula- anything that's upright and putting your pelvis in open, asymmetrical positions. Spend at least 30 minutes doing this one as well to give your baby a chance to move down. If you are lunging or stair or curb walking, you should lunge/walk/go up stairs in the direction that feels better to you. The key with the lunge is that the toes of the higher leg and mom's belly button should be at right angles. Do not lunge over your knee, that closes the pelvis.     Melita Hamilton [name]: Circuit Creator - [email or web address] Sharon   Muza, CD, BDT (DONA), LCCE, FACCE: Supporting Content - [email or web address] Rulon Eisenmenger: Photography - [email or web address] Luther Hearing CD/CDT [facility]): Print and Webmaster - [email or web address] [employer] [email or web address]    Cervical Ripening (to get your cervix ready for labor) : May try one or all:  Red Raspberry Leaf capsules:  two 300mg  or 400mg  tablets with each meal, 2-3 times a day  Potential Side Effects Of Raspberry Leaf:  Most women do not experience any side effects  from drinking raspberry leaf tea. However, nausea and loose stools are possible     Evening Primrose Oil capsules: may take 1 to 3 capsules daily. May also prick one to release the oil and insert it into your vagina at night.  Some of the potential side effects:  Upset stomach  Loose stools or diarrhea  Headaches  Nausea   6 Dates a day (may taste better if warmed in microwave until soft). Found where raisins are in the grocery store

## 2021-01-05 NOTE — Progress Notes (Signed)
   PRENATAL VISIT NOTE  Subjective:  Anna [name] is a 25 y.o. G2P1001 at [redacted]w[redacted]d being seen today for ongoing prenatal care.  She is currently monitored for the following issues for this low-risk pregnancy and has Left ovarian cyst; Supervision of other normal pregnancy, antepartum; Low lying placenta, antepartum; and Anemia of pregnancy on their problem list.  Patient reports no complaints.  Contractions: Irritability. Vag. Bleeding: None.  Movement: Present. Denies leaking of fluid.   The following portions of the patient's history were reviewed and updated as appropriate: allergies, current medications, past family history, past medical history, past social history, past surgical history and problem list.   Objective:   Vitals:   01/05/21 1443  BP: 124/67  Pulse: 94  Weight: 213 lb (96.6 kg)    Fetal Status: Fetal Heart Rate (bpm): 154 Fundal Height: 37 cm Movement: Present  Presentation: Undeterminable  General:  Alert, oriented and cooperative. Patient is in no acute distress.  Skin: Skin is warm and dry. No rash noted.   Cardiovascular: Normal heart rate noted  Respiratory: Normal respiratory effort, no problems with respiration noted  Abdomen: Soft, gravid, appropriate for gestational age.  Pain/Pressure: Present     Pelvic: Cervical exam performed in the presence of a chaperone Dilation: Closed Effacement (%): Thick Station: Ballotable  Extremities: Normal range of motion.  Edema: Trace  Mental Status: Normal mood and affect. Normal behavior. Normal judgment and thought content.   Assessment and Plan:  Pregnancy: G2P1001 at [redacted]w[redacted]d 1. Supervision of other normal pregnancy, antepartum  - Culture, beta strep (group b only) - Cervicovaginal ancillary only( [place])  2. [redacted] weeks gestation of pregnancy  Doing well Discussed cervical ripening and weekly visits.   Preterm labor symptoms and general obstetric precautions including but not limited to vaginal bleeding,  contractions, leaking of fluid and fetal movement were reviewed in detail with the patient. Please refer to After Visit Summary for other counseling recommendations.   Return in about 1 week (around 01/12/2021).  No future appointments.  Venia Carbon, NP

## 2021-01-06 LAB — CERVICOVAGINAL ANCILLARY ONLY
Chlamydia: NEGATIVE
Comment: NEGATIVE
Comment: NORMAL
Neisseria Gonorrhea: NEGATIVE

## 2021-01-08 LAB — CULTURE, BETA STREP (GROUP B ONLY)
MICRO NUMBER:: 11709085
SPECIMEN QUALITY:: ADEQUATE

## 2021-01-12 ENCOUNTER — Ambulatory Visit (INDEPENDENT_AMBULATORY_CARE_PROVIDER_SITE_OTHER): Payer: Medicaid Other | Admitting: Advanced Practice Midwife

## 2021-01-12 ENCOUNTER — Other Ambulatory Visit: Payer: Self-pay

## 2021-01-12 VITALS — BP 125/82 | HR 116 | Wt 214.0 lb

## 2021-01-12 DIAGNOSIS — O4440 Low lying placenta NOS or without hemorrhage, unspecified trimester: Secondary | ICD-10-CM

## 2021-01-12 DIAGNOSIS — Z3A37 37 weeks gestation of pregnancy: Secondary | ICD-10-CM | POA: Diagnosis not present

## 2021-01-12 DIAGNOSIS — O4443 Low lying placenta NOS or without hemorrhage, third trimester: Secondary | ICD-10-CM

## 2021-01-12 DIAGNOSIS — Z3480 Encounter for supervision of other normal pregnancy, unspecified trimester: Secondary | ICD-10-CM

## 2021-01-12 DIAGNOSIS — O26843 Uterine size-date discrepancy, third trimester: Secondary | ICD-10-CM | POA: Diagnosis not present

## 2021-01-12 DIAGNOSIS — O99013 Anemia complicating pregnancy, third trimester: Secondary | ICD-10-CM

## 2021-01-12 DIAGNOSIS — O99019 Anemia complicating pregnancy, unspecified trimester: Secondary | ICD-10-CM

## 2021-01-12 DIAGNOSIS — Z3689 Encounter for other specified antenatal screening: Secondary | ICD-10-CM

## 2021-01-12 NOTE — Progress Notes (Signed)
   PRENATAL VISIT NOTE  Subjective:  Anna [name] is a 25 y.o. G2P1001 at [redacted]w[redacted]d being seen today for ongoing prenatal care.  She is currently monitored for the following issues for this low-risk pregnancy and has Left ovarian cyst; Supervision of other normal pregnancy, antepartum; Low lying placenta, antepartum; and Anemia of pregnancy on their problem list.  Patient reports decreased fetal movement.  Contractions: Irritability. Vag. Bleeding: None.  Movement: (!) Decreased. Denies leaking of fluid.   The following portions of the patient's history were reviewed and updated as appropriate: allergies, current medications, past family history, past medical history, past social history, past surgical history and problem list.   Objective:   Vitals:   01/12/21 1422  BP: 125/82  Pulse: (!) 116  Weight: 214 lb (97.1 kg)    Fetal Status:     Movement: (!) Decreased     General:  Alert, oriented and cooperative. Patient is in no acute distress.  Skin: Skin is warm and dry. No rash noted.   Cardiovascular: Normal heart rate noted  Respiratory: Normal respiratory effort, no problems with respiration noted  Abdomen: Soft, gravid, appropriate for gestational age.  Pain/Pressure: Present     Pelvic: Cervical exam deferred        Extremities: Normal range of motion.  Edema: Trace  Mental Status: Normal mood and affect. Normal behavior. Normal judgment and thought content.   Assessment and Plan:  Pregnancy: G2P1001 at [redacted]w[redacted]d 1. Antepartum anemia --no s/sx, on oral iron  2. Low lying placenta, antepartum --Resolved at 26 weeks  3. Supervision of other normal pregnancy, antepartum --Anticipatory guidance about next visits/weeks of pregnancy given. --Next visit in 1 week in office  4. NST (non-stress test) reactive --Decreased fetal movement reported, pt is feeling movement, but smaller and less movements than usual. --NST reactive with multiple accelerations, pt feeling more movement since  NST --Precautions/reasons to go to hospital reviewed  5. Uterine size date discrepancy pregnancy, third trimester --FH 1 cm ahead last visit, 2 cm ahead today.  EFW 8.5 lbs by [place].  [place] ordered, may not be able to get in for [place] before labor.  Pt last baby 7+ lbs, no problems with labor or delivery.  No GDM this pregnancy. - [place] MFM OB FOLLOW UP; Future  6. [redacted] weeks gestation of pregnancy  - [place] MFM OB FOLLOW UP; Future  Term labor symptoms and general obstetric precautions including but not limited to vaginal bleeding, contractions, leaking of fluid and fetal movement were reviewed in detail with the patient. Please refer to After Visit Summary for other counseling recommendations.   No follow-ups on file.  Future Appointments  Date Time Provider Department Center  01/19/2021  1:50 PM Rolm Bookbinder, CNM [facility]    Sharen Counter, CNM

## 2021-01-12 NOTE — Progress Notes (Signed)
Decreased fetal movement for 2 days

## 2021-01-15 ENCOUNTER — Ambulatory Visit: Payer: Medicaid Other

## 2021-01-19 ENCOUNTER — Ambulatory Visit (INDEPENDENT_AMBULATORY_CARE_PROVIDER_SITE_OTHER): Payer: Medicaid Other

## 2021-01-19 ENCOUNTER — Other Ambulatory Visit: Payer: Self-pay

## 2021-01-19 VITALS — BP 124/71 | HR 104 | Wt 214.0 lb

## 2021-01-19 DIAGNOSIS — Z3480 Encounter for supervision of other normal pregnancy, unspecified trimester: Secondary | ICD-10-CM

## 2021-01-19 DIAGNOSIS — Z3A38 38 weeks gestation of pregnancy: Secondary | ICD-10-CM

## 2021-01-19 DIAGNOSIS — O26843 Uterine size-date discrepancy, third trimester: Secondary | ICD-10-CM

## 2021-01-19 NOTE — Progress Notes (Signed)
   PRENATAL VISIT NOTE  Subjective:  Anna [name] is a 25 y.o. G2P1001 at [redacted]w[redacted]d being seen today for ongoing prenatal care.  She is currently monitored for the following issues for this low-risk pregnancy and has Left ovarian cyst; Supervision of other normal pregnancy, antepartum; Low lying placenta, antepartum; and Anemia of pregnancy on their problem list.  Patient reports no complaints.  Contractions: Irritability. Vag. Bleeding: None.  Movement: Present. Denies leaking of fluid.   The following portions of the patient's history were reviewed and updated as appropriate: allergies, current medications, past family history, past medical history, past social history, past surgical history and problem list.   Objective:   Vitals:   01/19/21 1400  BP: 124/71  Pulse: (!) 104  Weight: 214 lb (97.1 kg)    Fetal Status: Fetal Heart Rate (bpm): 146 Fundal Height: 40 cm Movement: Present     General:  Alert, oriented and cooperative. Patient is in no acute distress.  Skin: Skin is warm and dry. No rash noted.   Cardiovascular: Normal heart rate noted  Respiratory: Normal respiratory effort, no problems with respiration noted  Abdomen: Soft, gravid, appropriate for gestational age.  Pain/Pressure: Present     Pelvic: Cervical exam performed in the presence of a chaperone Dilation: 1.5 Effacement (%): 50 Station: 0  Extremities: Normal range of motion.  Edema: Trace  Mental Status: Normal mood and affect. Normal behavior. Normal judgment and thought content.   Assessment and Plan:  Pregnancy: G2P1001 at [redacted]w[redacted]d 1. Supervision of other normal pregnancy, antepartum -No complaints. Routine care -Consider IOL for S>D. Unable to get follow up growth due to schedule challenges at [facility]. Patient feels like this baby is bigger than last. -Desires membrane sweep next visit if still pregnant.  -Patient lives in [place] and concerned about drive. Labor precautions reviewed.   2. Uterine size date  discrepancy pregnancy, third trimester   3. [redacted] weeks gestation of pregnancy   Term labor symptoms and general obstetric precautions including but not limited to vaginal bleeding, contractions, leaking of fluid and fetal movement were reviewed in detail with the patient. Please refer to After Visit Summary for other counseling recommendations.   Return in about 1 week (around 01/26/2021).  No future appointments.  Rolm Bookbinder, CNM

## 2021-01-19 NOTE — Patient Instructions (Signed)

## 2021-01-20 ENCOUNTER — Inpatient Hospital Stay (EMERGENCY_DEPARTMENT_HOSPITAL)
Admission: AD | Admit: 2021-01-20 | Discharge: 2021-01-20 | Disposition: A | Discharge status: Home / Self Care | Payer: Medicaid Other | Source: Home / Self Care | Attending: Obstetrics and Gynecology | Admitting: Obstetrics and Gynecology

## 2021-01-20 ENCOUNTER — Encounter (HOSPITAL_COMMUNITY): Payer: Self-pay | Admitting: Obstetrics and Gynecology

## 2021-01-20 ENCOUNTER — Inpatient Hospital Stay (HOSPITAL_COMMUNITY)
Admission: AD | Admit: 2021-01-20 | Discharge: 2021-01-22 | DRG: 807 | Disposition: A | Discharge status: Home / Self Care | Payer: Medicaid Other | Attending: Obstetrics and Gynecology | Admitting: Obstetrics and Gynecology

## 2021-01-20 DIAGNOSIS — Z3A38 38 weeks gestation of pregnancy: Secondary | ICD-10-CM

## 2021-01-20 DIAGNOSIS — Z3480 Encounter for supervision of other normal pregnancy, unspecified trimester: Secondary | ICD-10-CM

## 2021-01-20 DIAGNOSIS — O471 False labor at or after 37 completed weeks of gestation: Secondary | ICD-10-CM | POA: Insufficient documentation

## 2021-01-20 DIAGNOSIS — O479 False labor, unspecified: Secondary | ICD-10-CM | POA: Diagnosis not present

## 2021-01-20 DIAGNOSIS — O99019 Anemia complicating pregnancy, unspecified trimester: Secondary | ICD-10-CM | POA: Diagnosis present

## 2021-01-20 DIAGNOSIS — O4440 Low lying placenta NOS or without hemorrhage, unspecified trimester: Secondary | ICD-10-CM | POA: Diagnosis present

## 2021-01-20 DIAGNOSIS — Z3689 Encounter for other specified antenatal screening: Secondary | ICD-10-CM

## 2021-01-20 DIAGNOSIS — Z20822 Contact with and (suspected) exposure to covid-19: Secondary | ICD-10-CM | POA: Diagnosis present

## 2021-01-20 DIAGNOSIS — O9902 Anemia complicating childbirth: Secondary | ICD-10-CM | POA: Diagnosis present

## 2021-01-20 LAB — POCT FERN TEST: POCT Fern Test: POSITIVE

## 2021-01-20 MED ORDER — LACTATED RINGERS IV SOLN
500.0000 mL | INTRAVENOUS | Status: DC | PRN
  Administered 2021-01-20: 1000 mL via INTRAVENOUS

## 2021-01-20 MED ORDER — LACTATED RINGERS IV SOLN: INTRAVENOUS | Status: DC

## 2021-01-20 NOTE — MAU Note (Signed)
Water broke about 2200. Clear fld. Also having ctxs. Was seen MAU earlier today and was 2cm at 0400

## 2021-01-20 NOTE — Discharge Instructions (Signed)
Labor and Vaginal Delivery  Vaginal delivery means that you give birth by pushing your baby out of your birth canal (vagina). A team of health care providers will help you before, during, and after vaginal delivery. Birth experiences are unique for every woman and every pregnancy, and birth experiences vary depending on where you choose to give birth. What happens when I arrive at the birth center or hospital? Once you are in labor and have been admitted into the hospital or birth center, your health care provider may:  Review your pregnancy history and any concerns that you have.  Insert an IV into one of your veins. This may be used to give you fluids and medicines.  Check your blood pressure, pulse, temperature, and heart rate (vital signs).  Check whether your bag of water (amniotic sac) has broken (ruptured).  Talk with you about your birth plan and discuss pain control options. Monitoring Your health care provider may monitor your contractions (uterine monitoring) and your baby's heart rate (fetal monitoring). You may need to be monitored:  Often, but not continuously (intermittently).  All the time or for long periods at a time (continuously). Continuous monitoring may be needed if: ? You are taking certain medicines, such as medicine to relieve pain or make your contractions stronger. ? You have pregnancy or labor complications. Monitoring may be done by:  Placing a special stethoscope or a handheld monitoring device on your abdomen to check your baby's heartbeat and to check for contractions.  Placing monitors on your abdomen (external monitors) to record your baby's heartbeat and the frequency and length of contractions.  Placing monitors inside your uterus through your vagina (internal monitors) to record your baby's heartbeat and the frequency, length, and strength of your contractions. Depending on the type of monitor, it may remain in your uterus or on your baby's head  until birth.  Telemetry. This is a type of continuous monitoring that can be done with external or internal monitors. Instead of having to stay in bed, you are able to move around during telemetry. Physical exam Your health care provider may perform frequent physical exams. This may include:  Checking how and where your baby is positioned in your uterus.  Checking your cervix to determine: ? Whether it is thinning out (effacing). ? Whether it is opening up (dilating). What happens during labor and delivery? Normal labor and delivery is divided into the following three stages: Stage 1  This is the longest stage of labor.  This stage can last for hours or days.  Throughout this stage, you will feel contractions. Contractions generally feel mild, infrequent, and irregular at first. They get stronger, more frequent (about every 2-3 minutes), and more regular as you move through this stage.  This stage ends when your cervix is completely dilated to 4 inches (10 cm) and completely effaced. Stage 2  This stage starts once your cervix is completely effaced and dilated and lasts until the delivery of your baby.  This stage may last from 20 minutes to 2 hours.  This is the stage where you will feel an urge to push your baby out of your vagina.  You may feel stretching and burning pain, especially when the widest part of your baby's head passes through the vaginal opening (crowning).  Once your baby is delivered, the umbilical cord will be clamped and cut. This usually occurs after waiting a period of 1-2 minutes after delivery.  Your baby will be placed on your bare  chest (skin-to-skin contact) in an upright position and covered with a warm blanket. Watch your baby for feeding cues, like rooting or sucking, and help the baby to your breast for his or her first feeding. Stage 3  This stage starts immediately after the birth of your baby and ends after you deliver the placenta.  This stage  may take anywhere from 5 to 30 minutes.  After your baby has been delivered, you will feel contractions as your body expels the placenta and your uterus contracts to control bleeding.   What can I expect after labor and delivery?  After labor is over, you and your baby will be monitored closely until you are ready to go home to ensure that you are both healthy. Your health care team will teach you how to care for yourself and your baby.  You and your baby will stay in the same room (rooming in) during your hospital stay. This will encourage early bonding and successful breastfeeding.  You may continue to receive fluids and medicines through an IV.  Your uterus will be checked and massaged regularly (fundal massage).  You will have some soreness and pain in your abdomen, vagina, and the area of skin between your vaginal opening and your anus (perineum).  If an incision was made near your vagina (episiotomy) or if you had some vaginal tearing during delivery, cold compresses may be placed on your episiotomy or your tear. This helps to reduce pain and swelling.  You may be given a squirt bottle to use instead of wiping when you go to the bathroom. To use the squirt bottle, follow these steps: ? Before you urinate, fill the squirt bottle with warm water. Do not use hot water. ? After you urinate, while you are sitting on the toilet, use the squirt bottle to rinse the area around your urethra and vaginal opening. This rinses away any urine and blood. ? Fill the squirt bottle with clean water every time you use the bathroom.  It is normal to have vaginal bleeding after delivery. Wear a sanitary pad for vaginal bleeding and discharge. Summary  Vaginal delivery means that you will give birth by pushing your baby out of your birth canal (vagina).  Your health care provider may monitor your contractions (uterine monitoring) and your baby's heart rate (fetal monitoring).  Your health care provider  may perform a physical exam.  Normal labor and delivery is divided into three stages.  After labor is over, you and your baby will be monitored closely until you are ready to go home. This information is not intended to replace advice given to you by your health care provider. Make sure you discuss any questions you have with your health care provider. Document Revised: 10/31/2017 Document Reviewed: 10/31/2017 Elsevier Patient Education  2021 [employer].

## 2021-01-20 NOTE — MAU Note (Signed)
Pt reports ctx started around 0930 pm. Have progressivly gotten worse, currently 5/10. Denies VB, LOF, and reports +FM.

## 2021-01-20 NOTE — H&P (Addendum)
OBSTETRIC ADMISSION HISTORY AND PHYSICAL  Anna [name] is a 25 y.o. female G2P1001 with IUP at [redacted]w[redacted]d by 9 week [place] presenting for SROM at 2200 with mild, irreg ctx since. She reports +FMs, LOF, no VB, no blurry vision, headaches or peripheral edema, and RUQ pain.  She plans on breast feeding. She declines birth control. She received her prenatal [facility] at [facility]   Dating: By 9 week [place] --->  Estimated Date of Delivery: 02/02/21  Sono:    @[redacted]w[redacted]d , CWD, normal anatomy, Cephalic presentation, Posterior lie, 947g, 61% EFW   Prenatal History/Complications: Anemia  Past Medical History: Past Medical History:  Diagnosis Date  . Insufficient prenatal care in third trimester     Clinic  [facility] Prenatal Labs  Dating LMP Blood type: B/POS/-- (05/18 1238)   Genetics Too late Antibody:NEG (05/18 1238)  Anatomic 02[birth year]01[birth year]1982 Nml but limited due gestational age (33 weeks) Rubella: 1.59 (05/18 1238)  GTT Early:               Third trimester: 66 RPR: NON REAC (05/18 1238)   Flu vaccine  Declined HBsAg: NEGATIVE (05/18 1238)   TDaP vaccine       declines                                         . Medical history non-contributory   . No prenatal care in current pregnancy in second trimester     Past Surgical History: Past Surgical History:  Procedure Laterality Date  . NO PAST SURGERIES      Obstetrical History: OB History    Gravida  2   Para  1   Term  1   Preterm      AB      Living  1     SAB      IAB      Ectopic      Multiple  0   Live Births  1           Social History Social History   Socioeconomic History  . Marital status: Single    Spouse name: Not on file  . Number of children: Not on file  . Years of education: Not on file  . Highest education level: Not on file  Occupational History  . Not on file  Tobacco Use  . Smoking status: Never Smoker  . Smokeless tobacco: Never Used  Vaping Use  . Vaping Use: Never used  Substance and Sexual Activity  . Alcohol  use: No  . Drug use: No  . Sexual activity: Yes    Birth control/protection: None  Other Topics Concern  . Not on file  Social History Narrative  . Not on file   Social Determinants of Health   Financial Resource Strain: Not on file  Food Insecurity: Not on file  Transportation Needs: Not on file  Physical Activity: Not on file  Stress: Not on file  Social Connections: Not on file    Family History: Family History  Problem Relation Age of Onset  . Diabetes Paternal Grandfather     Allergies: No Known Allergies  Medications Prior to Admission  Medication Sig Dispense Refill Last Dose  . iron polysaccharides (NIFEREX) 150 MG capsule Take 1 capsule (150 mg total) by mouth daily. 30 capsule 1 01/19/2021 at Unknown time  . Prenatal Vit-Fe Fumarate-FA (PRENATAL VITAMINS PLUS) 27-1 MG  TABS Take 1 tablet by mouth daily.   01/19/2021 at Unknown time     Review of Systems   All systems reviewed and negative except as stated in HPI  Blood pressure 127/70, pulse (!) 122, height 5\' 3"  (1.6 m), last menstrual period 04/23/2020, SpO2 99 %, unknown if currently breastfeeding. General appearance: alert and cooperative Presentation: cephalic Fetal monitoringBaseline: 135 bpm, Variability: Good {> 6 bpm), Accelerations: Non-reactive but appropriate for gestational age and Decelerations: Absent Uterine activityFrequency: Every 4 minutes Dilation: 2 Effacement (%): 70 Station: -1 Exam by:: [email or web address] RNC   Prenatal labs: ABO, Rh: B/RH(D) POSITIVE/-- (09/28 1517) Antibody: NO ANTIBODIES DETECTED (09/28 1517) Rubella: 2.35 (09/28 1517) RPR: NON-REACTIVE (02/25 0000)  HBsAg: NON-REACTIVE (09/28 1517)  HIV: NON-REACTIVE (02/25 0000)  GBS:    1 hr Glucola: Normal Genetic screening  Normal Anatomy [birth year]1995: Normal  Prenatal Transfer Tool  Maternal Diabetes: No Genetic Screening: Normal Maternal Ultrasounds/Referrals: Normal  Fetal Ultrasounds or other Referrals:  None Maternal  Substance Abuse:  No Significant Maternal Medications:  None Significant Maternal Lab Results: Group B Strep negative  Results for orders placed or performed during the hospital encounter of 01/20/21 (from the past 24 hour(s))  POCT fern test   Collection Time: 01/20/21 11:36 PM  Result Value Ref Range   POCT Fern Test Positive = ruptured amniotic membanes     Patient Active Problem List   Diagnosis Date Noted  . Anemia of pregnancy 12/06/2020  . Low lying placenta, antepartum 09/11/2020  . Supervision of other normal pregnancy, antepartum 07/07/2020  . Left ovarian cyst 07/06/2020    Assessment/Plan:  Anna [name] is a 25 y.o. G2P1001 at [redacted]w[redacted]d here for SROM at 2200.  #Labor: Expectant management. #Pain: Requests Epidural. #FWB: Category 1 #ID: GBS Negative #MOF: Breast #MOC: None #Circ:  Declines #Anemia:  On PO iron throughout pregnancy.  Check CBC.  [redacted]w[redacted]d, MD  01/20/2021, 11:41 PM  CNM attestation:  I have seen and examined this patient; I agree with above documentation in the resident's note.   Anna [name] is a 25 y.o. G2P1001 here for SROM at 2200 with mild, irreg ctx since.  PE: BP 129/69   Pulse 69   Ht 5\' 3"  (1.6 m)   Wt 95.7 kg   LMP 04/23/2020   SpO2 99%   BMI 37.38 kg/m  Gen: calm comfortable, NAD Resp: normal effort, no distress Abd: gravid  ROS, labs, PMH reviewed (admission Hgb 9.7)  Plan: -Admit to Labor and Delivery -Prefers expectant management- would like to labor spontaneously if possible -Will reeval later in the morning and determine a plan, depending on the situation at that time -Ambien prn -Anticipate vag del  CNM 01/21/2021, 1:29 AM

## 2021-01-20 NOTE — MAU Provider Note (Signed)
S: Ms. Anna [name] is a 25 y.o. G2P1001 at [redacted]w[redacted]d  who presents to MAU today for labor evaluation.     Cervical exam by RN:  Dilation: 2 Effacement (%): 60 Cervical Position: Posterior Station: -1 Presentation: Vertex Exam by:: K Torphy, RN No change over time   Fetal Monitoring: Baseline: 140 Variability: average Accelerations: present Decelerations: absent Contractions: irregular  MDM Discussed patient with RN. NST reviewed.   A: SIUP at [redacted]w[redacted]d  False labor  P: Discharge home Labor precautions and kick counts included in AVS Patient to follow-up with office as scheduled  Patient may return to MAU as needed or when in labor   Aviva Signs, CNM 01/20/2021 4:20 AM

## 2021-01-20 NOTE — Clinical Note (Incomplete)
OBSTETRIC ADMISSION HISTORY AND PHYSICAL  Anna [name] is a 25 y.o. female G2P1001 with IUP at [redacted]w[redacted]d by 9 week [place] presenting for SROM. She reports +FMs, No LOF, no VB, no blurry vision, headaches or peripheral edema, and RUQ pain.  She plans on *** feeding. She request *** for birth control. She received her prenatal care at {Blank single:19197::"[facility]"}   Dating: By 9 week [place] --->  Estimated Date of Delivery: 02/02/21  Sono:    @[redacted]w[redacted]d , CWD, normal anatomy, Cephalic presentation, Posterior lie, 947g, 61% EFW   Prenatal History/Complications: Anemia  Past Medical History: Past Medical History:  Diagnosis Date  . Insufficient prenatal care in third trimester     Clinic  [facility] Prenatal Labs  Dating LMP Blood type: B/POS/-- (05/18 1238)   Genetics Too late Antibody:NEG (05/18 1238)  Anatomic 02-01-1982 Nml but limited due gestational age (33 weeks) Rubella: 1.59 (05/18 1238)  GTT Early:               Third trimester: 66 RPR: NON REAC (05/18 1238)   Flu vaccine  Declined HBsAg: NEGATIVE (05/18 1238)   TDaP vaccine       declines                                         . Medical history non-contributory   . No prenatal care in current pregnancy in second trimester     Past Surgical History: Past Surgical History:  Procedure Laterality Date  . NO PAST SURGERIES      Obstetrical History: OB History    Gravida  2   Para  1   Term  1   Preterm      AB      Living  1     SAB      IAB      Ectopic      Multiple  0   Live Births  1           Social History Social History   Socioeconomic History  . Marital status: Single    Spouse name: Not on file  . Number of children: Not on file  . Years of education: Not on file  . Highest education level: Not on file  Occupational History  . Not on file  Tobacco Use  . Smoking status: Never Smoker  . Smokeless tobacco: Never Used  Vaping Use  . Vaping Use: Never used  Substance and Sexual Activity  .  Alcohol use: No  . Drug use: No  . Sexual activity: Yes    Birth control/protection: None  Other Topics Concern  . Not on file  Social History Narrative  . Not on file   Social Determinants of Health   Financial Resource Strain: Not on file  Food Insecurity: Not on file  Transportation Needs: Not on file  Physical Activity: Not on file  Stress: Not on file  Social Connections: Not on file    Family History: Family History  Problem Relation Age of Onset  . Diabetes Paternal Grandfather     Allergies: No Known Allergies  Medications Prior to Admission  Medication Sig Dispense Refill Last Dose  . iron polysaccharides (NIFEREX) 150 MG capsule Take 1 capsule (150 mg total) by mouth daily. 30 capsule 1 01/19/2021 at Unknown time  . Prenatal Vit-Fe Fumarate-FA (PRENATAL VITAMINS PLUS) 27-1 MG TABS Take 1  tablet by mouth daily.   01/19/2021 at Unknown time     Review of Systems   All systems reviewed and negative except as stated in HPI  Blood pressure 127/70, pulse (!) 122, height 5\' 3"  (1.6 m), last menstrual period 04/23/2020, SpO2 99 %, unknown if currently breastfeeding. General appearance: {general exam:16600} Lungs: clear to auscultation bilaterally Heart: regular rate and rhythm Abdomen: soft, non-tender; bowel sounds normal Pelvic: *** Extremities: Homans sign is negative, no sign of DVT DTR's *** Presentation: {desc; fetal presentation:14558} Fetal monitoring{findings; monitor fetal heart monitor:31527} Uterine activity{Uterine contractions:31516} Dilation: 2 Effacement (%): 70 Station: -1 Exam by:: [email or web address] RNC   Prenatal labs: ABO, Rh: B/RH(D) POSITIVE/-- (09/28 1517) Antibody: NO ANTIBODIES DETECTED (09/28 1517) Rubella: 2.35 (09/28 1517) RPR: NON-REACTIVE (02/25 0000)  HBsAg: NON-REACTIVE (09/28 1517)  HIV: NON-REACTIVE (02/25 0000)  GBS:    1 hr Glucola: Normal Genetic screening  Normal Anatomy 03[birth year]1995: Normal  Prenatal Transfer Tool  Maternal  Diabetes: No Genetic Screening: Normal Maternal Ultrasounds/Referrals: Other: Limited in 3rd trimester Fetal Ultrasounds or other Referrals:  None Maternal Substance Abuse:  No Significant Maternal Medications:  None Significant Maternal Lab Results: Group B Strep negative  Results for orders placed or performed during the hospital encounter of 01/20/21 (from the past 24 hour(s))  POCT fern test   Collection Time: 01/20/21 11:36 PM  Result Value Ref Range   POCT Fern Test Positive = ruptured amniotic membanes     Patient Active Problem List   Diagnosis Date Noted  . Anemia of pregnancy 12/06/2020  . Low lying placenta, antepartum 09/11/2020  . Supervision of other normal pregnancy, antepartum 07/07/2020  . Left ovarian cyst 07/06/2020    Assessment/Plan:  Anna [name] is a 25 y.o. G2P1001 at [redacted]w[redacted]d here for SROM.  #Labor:*** #Pain: Requests Epidural. #FWB: *** #ID:  *** #MOF: *** #MOC:*** #Circ:  ***  [redacted]w[redacted]d, MD  01/20/2021, 11:41 PM

## 2021-01-21 ENCOUNTER — Encounter (HOSPITAL_COMMUNITY): Payer: Self-pay | Admitting: Obstetrics and Gynecology

## 2021-01-21 ENCOUNTER — Inpatient Hospital Stay (HOSPITAL_COMMUNITY): Payer: Medicaid Other | Admitting: Anesthesiology

## 2021-01-21 ENCOUNTER — Other Ambulatory Visit: Payer: Self-pay

## 2021-01-21 DIAGNOSIS — Z3493 Encounter for supervision of normal pregnancy, unspecified, third trimester: Secondary | ICD-10-CM | POA: Insufficient documentation

## 2021-01-21 DIAGNOSIS — O4202 Full-term premature rupture of membranes, onset of labor within 24 hours of rupture: Secondary | ICD-10-CM | POA: Diagnosis not present

## 2021-01-21 DIAGNOSIS — Z20822 Contact with and (suspected) exposure to covid-19: Secondary | ICD-10-CM | POA: Diagnosis present

## 2021-01-21 DIAGNOSIS — O26893 Other specified pregnancy related conditions, third trimester: Secondary | ICD-10-CM | POA: Diagnosis present

## 2021-01-21 DIAGNOSIS — Z3A38 38 weeks gestation of pregnancy: Secondary | ICD-10-CM | POA: Diagnosis not present

## 2021-01-21 DIAGNOSIS — O9902 Anemia complicating childbirth: Secondary | ICD-10-CM | POA: Diagnosis present

## 2021-01-21 LAB — RESP PANEL BY RT-PCR (FLU A&B, COVID) ARPGX2
Influenza A by PCR: NEGATIVE
Influenza B by PCR: NEGATIVE
SARS Coronavirus 2 by RT PCR: NEGATIVE

## 2021-01-21 LAB — CBC
HCT: 30.8 % — ABNORMAL LOW (ref 36.0–46.0)
Hemoglobin: 9.7 g/dL — ABNORMAL LOW (ref 12.0–15.0)
MCH: 26.7 pg (ref 26.0–34.0)
MCHC: 31.5 g/dL (ref 30.0–36.0)
MCV: 84.8 fL (ref 80.0–100.0)
Platelets: 255 10*3/uL (ref 150–400)
RBC: 3.63 MIL/uL — ABNORMAL LOW (ref 3.87–5.11)
RDW: 14.4 % (ref 11.5–15.5)
WBC: 12.8 10*3/uL — ABNORMAL HIGH (ref 4.0–10.5)
nRBC: 0 % (ref 0.0–0.2)

## 2021-01-21 LAB — TYPE AND SCREEN
ABO/RH(D): B POS
Antibody Screen: NEGATIVE

## 2021-01-21 LAB — RPR: RPR Ser Ql: NONREACTIVE

## 2021-01-21 MED ORDER — SIMETHICONE 80 MG PO CHEW: 80.0000 mg | CHEWABLE_TABLET | ORAL | Status: DC | PRN

## 2021-01-21 MED ORDER — BENZOCAINE-MENTHOL 20-0.5 % EX AERO
1.0000 "application " | INHALATION_SPRAY | CUTANEOUS | Status: DC | PRN
  Administered 2021-01-21: 1 via TOPICAL
  Filled 2021-01-21: qty 56

## 2021-01-21 MED ORDER — ONDANSETRON HCL 4 MG PO TABS: 4.0000 mg | ORAL_TABLET | ORAL | Status: DC | PRN

## 2021-01-21 MED ORDER — ACETAMINOPHEN 325 MG PO TABS: 650.0000 mg | ORAL_TABLET | ORAL | Status: DC | PRN

## 2021-01-21 MED ORDER — OXYTOCIN BOLUS FROM INFUSION
333.0000 mL | Freq: Once | INTRAVENOUS | Status: AC
  Administered 2021-01-21: 333 mL via INTRAVENOUS

## 2021-01-21 MED ORDER — OXYCODONE-ACETAMINOPHEN 5-325 MG PO TABS: 2.0000 | ORAL_TABLET | ORAL | Status: DC | PRN

## 2021-01-21 MED ORDER — COCONUT OIL OIL
1.0000 "application " | TOPICAL_OIL | Status: DC | PRN
  Administered 2021-01-21: 1 via TOPICAL

## 2021-01-21 MED ORDER — OXYCODONE-ACETAMINOPHEN 5-325 MG PO TABS: 1.0000 | ORAL_TABLET | ORAL | Status: DC | PRN

## 2021-01-21 MED ORDER — WITCH HAZEL-GLYCERIN EX PADS
1.0000 "application " | MEDICATED_PAD | CUTANEOUS | Status: DC | PRN
  Administered 2021-01-22: 1 via TOPICAL

## 2021-01-21 MED ORDER — FENTANYL-BUPIVACAINE-NACL 0.5-0.125-0.9 MG/250ML-% EP SOLN
12.0000 mL/h | EPIDURAL | Status: DC | PRN
  Filled 2021-01-21: qty 250

## 2021-01-21 MED ORDER — EPHEDRINE 5 MG/ML INJ: 10.0000 mg | INTRAVENOUS | Status: DC | PRN

## 2021-01-21 MED ORDER — LIDOCAINE HCL (PF) 1 % IJ SOLN: 30.0000 mL | INTRAMUSCULAR | Status: DC | PRN

## 2021-01-21 MED ORDER — TERBUTALINE SULFATE 1 MG/ML IJ SOLN: 0.2500 mg | Freq: Once | INTRAMUSCULAR | Status: DC | PRN

## 2021-01-21 MED ORDER — DIBUCAINE (PERIANAL) 1 % EX OINT
1.0000 "application " | TOPICAL_OINTMENT | CUTANEOUS | Status: DC | PRN
  Administered 2021-01-22: 1 via RECTAL
  Filled 2021-01-21: qty 28

## 2021-01-21 MED ORDER — PHENYLEPHRINE 40 MCG/ML (10ML) SYRINGE FOR IV PUSH (FOR BLOOD PRESSURE SUPPORT): 80.0000 ug | PREFILLED_SYRINGE | INTRAVENOUS | Status: DC | PRN

## 2021-01-21 MED ORDER — SOD CITRATE-CITRIC ACID 500-334 MG/5ML PO SOLN: 30.0000 mL | ORAL | Status: DC | PRN

## 2021-01-21 MED ORDER — DIPHENHYDRAMINE HCL 50 MG/ML IJ SOLN: 12.5000 mg | INTRAMUSCULAR | Status: DC | PRN

## 2021-01-21 MED ORDER — LIDOCAINE HCL (PF) 1 % IJ SOLN
INTRAMUSCULAR | Status: DC | PRN
  Administered 2021-01-21: 5 mL via EPIDURAL

## 2021-01-21 MED ORDER — PHENYLEPHRINE 40 MCG/ML (10ML) SYRINGE FOR IV PUSH (FOR BLOOD PRESSURE SUPPORT)
80.0000 ug | PREFILLED_SYRINGE | INTRAVENOUS | Status: DC | PRN
  Filled 2021-01-21: qty 10

## 2021-01-21 MED ORDER — OXYTOCIN-SODIUM CHLORIDE 30-0.9 UT/500ML-% IV SOLN
1.0000 m[IU]/min | INTRAVENOUS | Status: DC
  Administered 2021-01-21: 2 m[IU]/min via INTRAVENOUS

## 2021-01-21 MED ORDER — OXYTOCIN-SODIUM CHLORIDE 30-0.9 UT/500ML-% IV SOLN
2.5000 [IU]/h | INTRAVENOUS | Status: DC
  Filled 2021-01-21: qty 500

## 2021-01-21 MED ORDER — PRENATAL MULTIVITAMIN CH
1.0000 | ORAL_TABLET | Freq: Every day | ORAL | Status: DC
  Administered 2021-01-22: 1 via ORAL
  Filled 2021-01-21: qty 1

## 2021-01-21 MED ORDER — FENTANYL-BUPIVACAINE-NACL 0.5-0.125-0.9 MG/250ML-% EP SOLN
EPIDURAL | Status: DC | PRN
  Administered 2021-01-21: 12 mL/h via EPIDURAL

## 2021-01-21 MED ORDER — DIPHENHYDRAMINE HCL 25 MG PO CAPS: 25.0000 mg | ORAL_CAPSULE | Freq: Four times a day (QID) | ORAL | Status: DC | PRN

## 2021-01-21 MED ORDER — ZOLPIDEM TARTRATE 5 MG PO TABS: 5.0000 mg | ORAL_TABLET | Freq: Every evening | ORAL | Status: DC | PRN

## 2021-01-21 MED ORDER — MAGNESIUM HYDROXIDE 400 MG/5ML PO SUSP: 30.0000 mL | ORAL | Status: DC | PRN

## 2021-01-21 MED ORDER — ONDANSETRON HCL 4 MG/2ML IJ SOLN: 4.0000 mg | Freq: Four times a day (QID) | INTRAMUSCULAR | Status: DC | PRN

## 2021-01-21 MED ORDER — IBUPROFEN 600 MG PO TABS
600.0000 mg | ORAL_TABLET | Freq: Four times a day (QID) | ORAL | Status: DC
  Administered 2021-01-21 – 2021-01-22 (×2): 600 mg via ORAL
  Filled 2021-01-21 (×3): qty 1

## 2021-01-21 MED ORDER — LACTATED RINGERS IV SOLN
500.0000 mL | Freq: Once | INTRAVENOUS | Status: AC
  Administered 2021-01-21: 500 mL via INTRAVENOUS

## 2021-01-21 MED ORDER — ONDANSETRON HCL 4 MG/2ML IJ SOLN: 4.0000 mg | INTRAMUSCULAR | Status: DC | PRN

## 2021-01-21 MED ORDER — FENTANYL CITRATE (PF) 100 MCG/2ML IJ SOLN: 50.0000 ug | INTRAMUSCULAR | Status: DC | PRN

## 2021-01-21 MED ORDER — IBUPROFEN 600 MG PO TABS: 600.0000 mg | ORAL_TABLET | Freq: Once | ORAL | Status: DC

## 2021-01-21 MED ORDER — ACETAMINOPHEN 325 MG PO TABS
650.0000 mg | ORAL_TABLET | ORAL | Status: DC | PRN
  Administered 2021-01-22: 650 mg via ORAL
  Filled 2021-01-21: qty 2

## 2021-01-21 NOTE — Progress Notes (Signed)
RN contacted for CNM-patient introduction at bedside. Purvis Kilts, RN shared that patient was being prepped for epidural. Will defer bedside introduction until patient is comfortable with epidural.  Clayton Bibles, MSN, CNM Certified Nurse Midwife, [facility], [facility] 01/21/21 8:43 AM

## 2021-01-21 NOTE — Progress Notes (Signed)
Anna [name] is a 25 y.o. G2P1001 at [redacted]w[redacted]d   Subjective: New onset irregular perineal pressure during contractions. Denies urge to push. Denies pain.   Objective: BP 115/60   Pulse 87   Temp (!) 97.5 F (36.4 C) (Oral)   Resp 18   Ht 5\' 3"  (1.6 m)   Wt 95.7 kg   LMP 04/23/2020   SpO2 100%   BMI 37.38 kg/m  No intake/output data recorded. No intake/output data recorded.  FHT:  FHR: 135 bpm, variability: moderate,  accelerations:  Present,  decelerations:  Absent UC:   regular, every 2-3 minutes SVE:   Dilation: 9 Effacement (%): 90 Station: 0 Exam by:: [email or web address], RN and Adcox, RN  Labs: Lab Results  Component Value Date   WBC 12.8 (H) 01/20/2021   HGB 9.7 (L) 01/20/2021   HCT 30.8 (L) 01/20/2021   MCV 84.8 01/20/2021   PLT 255 01/20/2021    Assessment / Plan: --25 y.o. G2P1001 at [redacted]w[redacted]d  --S/p SROM 2200 hours yesterday 04/13 --Afebrile, clear fluid, neither fetal nor maternal tachycardia --Now 9/90/22 per RN exam --Resting in left lateral, mild perineal pressure during ctx --Will reassess cervix, readiness for pushing at 1845 --Anticipate NSVD  03-26-1977, CNM 01/21/2021, 6:00 PM

## 2021-01-21 NOTE — Progress Notes (Signed)
Patient ID: Anna [name], female   DOB: 1996-06-12, 25 y.o.   MRN: [id number]  Breathing w some ctx now  BP 133/62, P 78 FHR 120-130s, +accels, no decels Ctx 3-5 mins Cx deferred (was 3-4/90/vtx -1 per RN at 6147)  IUP@38 .2wks ROM x 10h Latent labor GBS neg  Continue expectant management with augmentation as appropriate Anticipate vag del  Arabella Merles [facility] 01/21/2021

## 2021-01-21 NOTE — Discharge Instructions (Signed)

## 2021-01-21 NOTE — Anesthesia Preprocedure Evaluation (Signed)
Anesthesia Evaluation  Patient identified by MRN, date of birth, ID band Patient awake    Reviewed: Allergy & Precautions, NPO status , Patient's Chart, lab work & pertinent test results  Airway Mallampati: II  TM Distance: >3 FB Neck ROM: Full    Dental no notable dental hx. (+) Teeth Intact, Dental Advisory Given   Pulmonary neg pulmonary ROS,    Pulmonary exam normal breath sounds clear to auscultation       Cardiovascular Exercise Tolerance: Good Normal cardiovascular exam Rhythm:Regular Rate:Normal     Neuro/Psych negative neurological ROS  negative psych ROS   GI/Hepatic negative GI ROS, Neg liver ROS,   Endo/Other  negative endocrine ROS  Renal/GU negative Renal ROS     Musculoskeletal   Abdominal (+) + obese,   Peds  Hematology  (+) anemia , Lab Results      Component                Value               Date                      WBC                      12.8 (H)            01/20/2021                HGB                      9.7 (L)             01/20/2021                HCT                      30.8 (L)            01/20/2021                MCV                      84.8                01/20/2021                PLT                      255                 01/20/2021              Anesthesia Other Findings   Reproductive/Obstetrics (+) Pregnancy                             Anesthesia Physical Anesthesia Plan  ASA: III  Anesthesia Plan: Epidural   Post-op Pain Management:    Induction:   PONV Risk Score and Plan:   Airway Management Planned:   Additional Equipment:   Intra-op Plan:   Post-operative Plan:   Informed Consent:     Dental advisory given  Plan Discussed with:   Anesthesia Plan Comments: (38.2 wk G2P1 for LEA )        Anesthesia Quick Evaluation

## 2021-01-21 NOTE — Lactation Note (Signed)
This note was copied from a baby's chart. Lactation Consultation Note  Patient Name: Anna [name] [id number] Date: 01/21/2021 Reason for consult: Initial assessment;Early term 37-38.6wks Age:26 hours, P2, LGA infant ( greater than 9 lbs at birth) Per mom, this will be her first time latching infant at the breast. Mom latched infant on her right breast using the football hold position, infant latched with depth and breastfeed for 12 minutes, STS. Mom was doing STS as LC left the room. Mom knows to breastfeed by cues, 8 to 12 or more times within 24 hours, STS. Mom knows to ask RN or LC if she needs assistance with latching infant at the breast. LC discussed infant's input and out put with mom. Mom made aware of O/P services, breastfeeding support groups, community resources, and our phone # for post-discharge questions.  Maternal Data Has patient been taught Hand Expression?: Yes Does the patient have breastfeeding experience prior to this delivery?: Yes How long did the patient breastfeed?: Per mom, she breastfeed her 1st child for 3 weeks and supplemented with formula she felt she had low milk supply.  Feeding Mother's Current Feeding Choice: Breast Milk  LATCH Score Latch: Grasps breast easily, tongue down, lips flanged, rhythmical sucking.  Audible Swallowing: Spontaneous and intermittent  Type of Nipple: Everted at rest and after stimulation  Comfort (Breast/Nipple): Soft / non-tender  Hold (Positioning): Assistance needed to correctly position infant at breast and maintain latch.  LATCH Score: 9   Lactation Tools Discussed/Used    Interventions Interventions: Breast feeding basics reviewed;Assisted with latch;Skin to skin;Breast massage;Breast compression;Adjust position;Support pillows;Position options;Expressed milk  Discharge Pump: Personal WIC Program: Yes  Consult Status Consult Status: Follow-up Date: 01/22/21 Follow-up type: In-patient    Danelle Earthly 01/21/2021, 10:31 PM

## 2021-01-21 NOTE — Progress Notes (Signed)
Anna [name] is a 25 y.o. G2P1001 at [redacted]w[redacted]d   Subjective: S/p epidural, sitting in throne position. Reporting mild discomfort on her left side. FOB sleeping at bedside.   Objective: BP 136/66   Pulse 88   Temp 98.2 F (36.8 C) (Oral)   Resp 18   Ht 5\' 3"  (1.6 m)   Wt 95.7 kg   LMP 04/23/2020   SpO2 100%   BMI 37.38 kg/m  No intake/output data recorded. No intake/output data recorded.  FHT:  FHR: 130 bpm, variability: moderate,  accelerations:  Present,  decelerations:  Absent UC:   irregular, every  minutes SVE:   Dilation: 4 Effacement (%): 80 Station: -1 Exam by:: [email or web address], CNM  Labs: Lab Results  Component Value Date   WBC 12.8 (H) 01/20/2021   HGB 9.7 (L) 01/20/2021   HCT 30.8 (L) 01/20/2021   MCV 84.8 01/20/2021   PLT 255 01/20/2021    Assessment / Plan: --25 y.o. G2P1001 at [redacted]w[redacted]d  --Cat I tracing --GBS neg --S/p SROM 2200 last night ~ 16 hours ago, clear fluid, afebrile, neither maternal or fetal tachycardia --Negligible cervical change from previous assessment 4 hours ago and admission assessment --PT agreeable to starting Pitocin --Anticipate NSVD  [redacted]w[redacted]d, CNM 01/21/2021, 2:42 PM

## 2021-01-21 NOTE — Discharge Summary (Signed)
Postpartum Discharge Summary    Patient Name: Anna [name] DOB: [birth year] MRN: [id number]  Date of admission: 01/20/2021 Delivery date:01/21/2021  Delivering provider: Darlina Rumpf  Date of discharge: 01/22/2021  Admitting diagnosis: Supervision of low-risk pregnancy, third trimester [Z34.93] Intrauterine pregnancy: [redacted]w[redacted]d    Secondary diagnosis:  Active Problems:   Low lying placenta, antepartum   Anemia of pregnancy   Vaginal delivery  Additional problems: as noted above Discharge diagnosis: Term Pregnancy Delivered                                              Post partum procedures:N/A Augmentation: Pitocin Complications: None  Hospital course: Onset of Labor With Vaginal Delivery      25y.o. yo G2P1001 at 355w2das admitted in Latent Labor on 01/20/2021. Patient had an uncomplicated labor course as follows:  Membrane Rupture Time/Date: 10:00 PM ,01/20/2021   Delivery Method:Vaginal, Spontaneous  Episiotomy: None  Lacerations:  2nd degree;Perineal  Patient had an uncomplicated postpartum course.  She is ambulating, tolerating a regular diet, passing flatus, and urinating well. Patient is discharged home in stable condition on 01/22/21.  Newborn Data: Birth date:01/21/2021  Birth time:7:01 PM  Gender:Female  Living status:Living  Apgars:9 ,9  [id number]   Magnesium Sulfate received: No BMZ received: No Rhophylac:N/A MMR:N/A T-DaP: Declined during prenatal care Flu: No Transfusion:No  Physical exam  Vitals:   01/21/21 2156 01/22/21 0200 01/22/21 0605 01/22/21 1443  BP: 130/87 122/72 98/60 120/68  Pulse: 82 72 73 77  Resp: 18 16 18 18   Temp: 98.6 F (37 C) 98.1 F (36.7 C) 98.2 F (36.8 C) 98.6 F (37 C)  TempSrc: Oral Oral Oral Oral  SpO2:      Weight:      Height:       General: alert, cooperative and no distress Lochia: appropriate Uterine Fundus: firm Incision: N/A DVT Evaluation: No evidence of DVT seen on physical exam. Labs: Lab  Results  Component Value Date   WBC 12.8 (H) 01/20/2021   HGB 9.7 (L) 01/20/2021   HCT 30.8 (L) 01/20/2021   MCV 84.8 01/20/2021   PLT 255 01/20/2021   No flowsheet data found. Edinburgh Score: Edinburgh Postnatal Depression Scale Screening Tool 01/22/2021  I have been able to laugh and see the funny side of things. (No Data)     After visit meds:  Allergies as of 01/22/2021   No Known Allergies     Medication List    STOP taking these medications   iron polysaccharides 150 MG capsule Commonly known as: NIFEREX     TAKE these medications   acetaminophen 325 MG tablet Commonly known as: Tylenol Take 2 tablets (650 mg total) by mouth every 6 (six) hours as needed for mild pain, moderate pain, fever or headache (for pain scale < 4).   coconut oil Oil Apply 1 application topically as needed (nipple pain).   ferrous sulfate 325 (65 FE) MG tablet Take 1 tablet (325 mg total) by mouth every other day. Start taking on: January 24, 2021   ibuprofen 600 MG tablet Commonly known as: ADVIL Take 1 tablet (600 mg total) by mouth every 8 (eight) hours as needed for moderate pain or cramping.   PreNatal Vitamins Plus 27-1 MG Tabs Take 1 tablet by mouth daily.       Discharge home in  stable condition Infant Feeding: Breast Infant Disposition:home with mother Discharge instruction: per After Visit Summary and Postpartum booklet. Activity: Advance as tolerated. Pelvic rest for 6 weeks.  Diet: routine diet Future Appointments: Future Appointments  Date Time Provider [place]  01/26/2021 10:50 AM Julianne Handler, CNM [facility]   Follow up Visit: Message sent by Maryelizabeth Kaufmann, CNM on 01/21/2021  Please schedule this patient for a Virtual postpartum visit in 4 weeks with the following provider: Any provider. Additional Postpartum F/U:Postpartum Depression checkup  Low risk pregnancy complicated by: N/A Delivery mode:  Vaginal, Spontaneous  Anticipated Birth  Control:  Condoms  Ines Warf, Gildardo Cranker, MD OB Fellow, Faculty Practice 01/22/2021 3:45 PM

## 2021-01-21 NOTE — Progress Notes (Signed)
Afomia Blackley is a 25 y.o. G2P1001 at [redacted]w[redacted]d   Subjective: S/p epidural, FOB at bedside. Pt denies pain, pressure.   Objective: BP 135/69   Pulse 76   Temp 97.8 F (36.6 C) (Oral)   Resp 18   Ht 5\' 3"  (1.6 m)   Wt 95.7 kg   LMP 04/23/2020   SpO2 100%   BMI 37.38 kg/m  No intake/output data recorded. No intake/output data recorded.  FHT:  FHR: 135 bpm, variability: moderate,  accelerations:  Present,  decelerations:  Absent UC:   irregular, every 4-6 minutes SVE:   Dilation: 4 Effacement (%): 80 Station: -1 Exam by:: Sam, CNM  Labs: Lab Results  Component Value Date   WBC 12.8 (H) 01/20/2021   HGB 9.7 (L) 01/20/2021   HCT 30.8 (L) 01/20/2021   MCV 84.8 01/20/2021   PLT 255 01/20/2021    Assessment / Plan: --25 y.o. G2P1001 at [redacted]w[redacted]d  --S/p SROM at 2200 last night --Cat I tracing --GBS NEG --Epidural in place and effective  Labor --Now 12 hours s/p SROM, afebrile, clear fluid --Cervix essentially unchanged since admission --Records reviewed. Labor course with previous baby was identical, patient reached 18 hours s/p PROM and required Cytotec then Pitocin to accomplish cervical change --Discussed impact of epidural on spontaneous cervical change  --Advised Pitocin augmentation, patient declined --Advised recheck in 4 hours, initiate Pitocin at that time if unchanged. Pt agreeable --Encouraged patient to spending the next 4 hours with peanut ball, frequent position changes  [redacted]w[redacted]d, CNM 01/21/2021, 10:07 AM

## 2021-01-21 NOTE — Anesthesia Procedure Notes (Signed)
Epidural Patient location during procedure: OB Start time: 01/21/2021 8:57 AM End time: 01/21/2021 9:11 AM  Staffing Anesthesiologist: Trevor Iha, MD Performed: anesthesiologist   Preanesthetic Checklist Completed: patient identified, IV checked, site marked, risks and benefits discussed, surgical consent, monitors and equipment checked, pre-op evaluation and timeout performed  Epidural Patient position: sitting Prep: DuraPrep and site prepped and draped Patient monitoring: continuous pulse ox and blood pressure Approach: midline Location: L3-L4 Injection technique: LOR air  Needle:  Needle type: Tuohy  Needle gauge: 17 G Needle length: 9 cm and 9 Needle insertion depth: 5 cm cm Catheter type: closed end flexible Catheter size: 19 Gauge Catheter at skin depth: 10 cm Test dose: negative  Assessment Events: blood not aspirated, injection not painful, no injection resistance, no paresthesia and negative IV test  Additional Notes Patient identified. Risks/Benefits/Options discussed with patient including but not limited to bleeding, infection, nerve damage, paralysis, failed block, incomplete pain control, headache, blood pressure changes, nausea, vomiting, reactions to medication both or allergic, itching and postpartum back pain. Confirmed with bedside nurse the patient's most recent platelet count. Confirmed with patient that they are not currently taking any anticoagulation, have any bleeding history or any family history of bleeding disorders. Patient expressed understanding and wished to proceed. All questions were answered. Sterile technique was used throughout the entire procedure. Please see nursing notes for vital signs. Test dose was given through epidural needle and negative prior to continuing to dose epidural or start infusion. Warning signs of high block given to the patient including shortness of breath, tingling/numbness in hands, complete motor block, or any  concerning symptoms with instructions to call for help. Patient was given instructions on fall risk and not to get out of bed. All questions and concerns addressed with instructions to call with any issues. 1 Attempt (S) . Patient tolerated procedure well.

## 2021-01-21 NOTE — Progress Notes (Signed)
FHR 150 when EFM d/c for transfer to BS 

## 2021-01-22 MED ORDER — FERROUS SULFATE 325 (65 FE) MG PO TABS
325.0000 mg | ORAL_TABLET | ORAL | Status: DC
  Administered 2021-01-22: 325 mg via ORAL
  Filled 2021-01-22: qty 1

## 2021-01-22 MED ORDER — COCONUT OIL OIL: 1.0000 "application " | TOPICAL_OIL | 0 refills | Status: AC | PRN

## 2021-01-22 MED ORDER — IBUPROFEN 600 MG PO TABS: 600.0000 mg | ORAL_TABLET | Freq: Three times a day (TID) | ORAL | 0 refills | Status: AC | PRN

## 2021-01-22 MED ORDER — FERROUS SULFATE 325 (65 FE) MG PO TABS: 325.0000 mg | ORAL_TABLET | ORAL | 3 refills | Status: AC

## 2021-01-22 MED ORDER — ACETAMINOPHEN 325 MG PO TABS: 650.0000 mg | ORAL_TABLET | Freq: Four times a day (QID) | ORAL | Status: AC | PRN

## 2021-01-22 NOTE — Lactation Note (Signed)
This note was copied from a baby's chart. Lactation Consultation Note  Patient Name: Anna [name] [id number] Date: 01/22/2021 Reason for consult: Follow-up assessment;Mother's request;Difficult latch;Early term 37-38.6wks Age:26 hours   Mom has not been able to get infant to sustain latch at the breast, been giving drops of colostrum.  On arrival, infant in outfit swaddled. LC removed outfit with parents permission, tried to latch him at the breasts. Mom small nipples short shafted hard infant to sustain. Parents prefer to use either breast milk or "organic formula" they want to get on their way home live one and half hour away. The refused DBM.   LC attempt to latch infant at the breast and with help of NS. Mom set up on DEBP but only drops of colostrum collected and finger fed to the baby.  RN, Alfonzo Feller, provided formula.    Parents want to be discharged once infant hits 24 hrs at 1900.  Parents assisted with offering formula via curve tip syringe or 5 french with syringe with a 20 NS. Infant mainly transferred when pushing formula and did very little at the breast even with breast compression. By the time we switched to opposite breast, he would transfer without formula  but not consistently.   Infant has recessed chin and tends to tuck his bottom lip. Once you pull his chin down, he does coordinate his suck on the second breast he did better but for short periods of time.   Parents want to use EBM to supplement. Mom back on DEBP. Parents are capable to use 5 [place] with NS to offer EBM to infant after pumping.     Mom now pumping with DEBP and willing to continue feeding use 5 french and 20 NS with any EBM she is able to pump.   Breastfeeding supplementation guide provided with volumes required to supplement based on hrs of age. Parents are aware if infant not transferring from the breasts he would need more to feed as tolerated. Yellow slow flow nipple provided to paced bottle feed  EBM following guidelines after latching at the breast.   LC shared findings with RN, Alfonzo Feller as she is signing off to [employer], [place]. RN to reach out to provider to come and talk to parents who are asking to leave once infant hits 24 hr mark.   Plan 1 To feed based on cues 8-12x in 24 hr period no more than 4 hrs without an attempt. Mom to offer both breasts, look for swallows and use 20 NS if needed primed with EBM with 5 french          2. Pump ( WIC Pump at home) q 3 hrs for 15 minutes.            3. Offer EBM via spoon or paced bottle feeding with yellow slow flow nipple.             4. LC brochure of inpatient and outpatient services reviewed.  All questions answered at the end of the feeding.   Maternal Data Has patient been taught Hand Expression?: Yes  Feeding Mother's Current Feeding Choice: Breast Milk  LATCH Score Latch: Repeated attempts needed to sustain latch, nipple held in mouth throughout feeding, stimulation needed to elicit sucking reflex.  Audible Swallowing: A few with stimulation  Type of Nipple: Flat (small and short shafted)  Comfort (Breast/Nipple): Soft / non-tender  Hold (Positioning): Assistance needed to correctly position infant at breast and maintain latch.  LATCH  Score: 6   Lactation Tools Discussed/Used Tools: 15F feeding tube / Syringe;Pump;Flanges;Nipple Shields Nipple shield size: 20 Flange Size: 27 Breast pump type: Double-Electric Breast Pump Pump Education: Setup, frequency, and cleaning;Milk Storage Reason for Pumping: increase stimulation Pumping frequency: every 3 hrs for 15 minutes  Interventions Interventions: Breast feeding basics reviewed;Support pillows;Education;Assisted with latch;Position options;Skin to skin;Expressed milk;Breast massage;DEBP;Breast compression;Adjust position  Discharge Pump: Personal WIC Program: Yes (Mom in communication with WIC has electric pump at home)  Consult Status Consult Status:  Follow-up Date: 01/23/21 Follow-up type: In-patient    Anna [name]  Anna [name] 01/22/2021, 3:10 PM

## 2021-01-22 NOTE — Anesthesia Postprocedure Evaluation (Signed)
Anesthesia Post Note  Patient: Anna [name]  Procedure(s) Performed: AN AD HOC LABOR EPIDURAL     Patient location during evaluation: Mother Baby Anesthesia Type: Epidural Level of consciousness: awake and alert, oriented and patient cooperative Pain management: pain level controlled Vital Signs Assessment: post-procedure vital signs reviewed and stable Respiratory status: spontaneous breathing Cardiovascular status: stable Postop Assessment: no headache, epidural receding, patient able to bend at knees and no signs of nausea or vomiting Anesthetic complications: no Comments: Pt. States she is walking. Pain score 0.    No complications documented.  Last Vitals:  Vitals:   01/22/21 0200 01/22/21 0605  BP: 122/72 98/60  Pulse: 72 73  Resp: 16 18  Temp: 36.7 C 36.8 C  SpO2:      Last Pain:  Vitals:   01/22/21 0605  TempSrc: Oral  PainSc: 4    Pain Goal: Patients Stated Pain Goal: 0 (01/20/21 2252)                 Merrilyn Puma

## 2021-01-22 NOTE — Social Work (Signed)
CSW filed report with [employer] [place] C[employer]. Family has discharged, CPS will follow-up with family at home.   Darryn Kydd, MSW, LCSWA Clinical Social Work [facility] [phone] 

## 2021-01-22 NOTE — Progress Notes (Signed)
POSTPARTUM PROGRESS NOTE  Subjective: Anna [name] is a 25 y.o. [id number] s/p SVD at [redacted]w[redacted]d.  She reports she doing well. No acute events overnight. She denies any problems with ambulating, voiding or po intake. Denies nausea or vomiting. She has  passed flatus. Pain is moderately controlled.  Lochia is moderate.  Objective: Blood pressure 98/60, pulse 73, temperature 98.2 F (36.8 C), temperature source Oral, resp. rate 18, height 5\' 3"  (1.6 m), weight 95.7 kg, last menstrual period 04/23/2020, SpO2 98 %, unknown if currently breastfeeding.  Physical Exam:  General: alert, cooperative and no distress Chest: no respiratory distress Abdomen: soft, appropriately tender Uterine Fundus: firm and at level of umbilicus Extremities: No calf swelling or tenderness  no edema  Recent Labs    01/20/21 2355  HGB 9.7*  HCT 30.8*    Assessment/Plan: Anna [name] is a 25 y.o. 22 s/p SVD at [redacted]w[redacted]d. PPD#1  Routine Postpartum Care: Doing well, pain well-controlled.  -- Continue routine care, lactation support  -- Contraception: discussed at bedside, declines at this time  -- Feeding: breast -- Normocytic Anemia: Hgb 9.7 on admission. Will d/c on PO iron     Dispo: Plan for discharge PPD#2.  [redacted]w[redacted]d, MD OB Fellow, Faculty Practice 01/22/2021 7:14 AM

## 2021-01-22 NOTE — Clinical Social Work Maternal (Signed)
CLINICAL SOCIAL WORK MATERNAL/CHILD NOTE  Patient Details  Name: Anna [name] MRN: [id number] Date of Birth: 01/21/2022  Date:  01/22/2021  Clinical Social Worker Initiating Note:  Darra Lis, MSW, [place] Date/Time: Initiated:  01/22/21/0350     Child's Name:  Anna [name]   Biological Parents:  Mother,Father Cloretta Ned)   Need for Interpreter:  None   Reason for Referral:  Other (Comment) (Discharged AMA)   Address:  [address] [place] [address]    Phone number:  [phone] (home)   FOB [phone]  Additional phone number:   Household Members/Support Persons (HM/SP):   Household Member/Support Person 1,Household Member/Support Person 2   HM/SP Name Relationship DOB or Age  HM/SP -1 Cloretta Ned Significant Other 09/12/1989  HM/SP -2 Bowen Westley Gambles 05/03/2015  HM/SP -3        HM/SP -4        HM/SP -5        HM/SP -6        HM/SP -7        HM/SP -8          Natural Supports (not living in the home):  Immediate Family   Professional Supports: None   Employment: Self-employed   Type of Work: Air traffic controller   Education:      Homebound arranged:    Museum/gallery curator Resources:  Medicaid   Other Resources:  Physicist, medical ,[place]   Cultural/Religious Considerations Which May Impact Care:    Strengths:  Ability to meet basic needs ,Pediatrician chosen,Home prepared for child    Psychotropic Medications:         Pediatrician:    Careers adviser area  Pediatrician List:   [facility] ([address])  [place]      Pediatrician Fax Number:    Risk Factors/Current Problems:  Compliance with Treatment    Cognitive State:  Linear Thinking ,Alert    Mood/Affect:  Calm ,Comfortable    CSW Assessment: CSW verbally consulted by [facility] and informed parents are leaving AMA. CSW met with MOB and FOB to assess and offer  support. CSW introduced self and role. CSW observed infant in car seat and Pediatrician providing parents with pertinent information regarding infant's medical status and concern of leaving AMA. Pediatrician explained concerns surrounding infant being at higher risk for juandice, as well as infant currently feeding poorly. This information regarding poor feeding has also been relayed by the lactation consultant. Pediatrician expressed the importance of infant having a follow-up appointment scheduled as soon as possible and the desire to for infant to take 15-55m of formula. MOB reported they have an appointment established for Monday at 3:30p with [facility] MOB stated they plan to feed baby with organic formula.   CSW spoke with parents alone and inquired on the reasoning for wanting to leave early. FOB reported "we have a 26year old who could be at home alone right now and is a hour away." FOB stated MGM (Titus Mould has been watching infant and needs to get back to work. MOB expressed MGM has been out of work for 3 days, considering she went into false labor a few days ago. FOB shared that they have things they need to get from the store and with the possibility of a later discharge (24  hours at 7:01p), the stores in the area would be closed. FOB went on to state "there are a number of reasons." CSW informed parents that it is policy for a CPS report to be made when leaving AMA. MOB expressed understanding. CSW asked if there is any previous CPS history. FOB disclosed they had CPS involvement in 2016 with [facility], following the birth of their son. FOB stated he had [employer] in his system and once the CPS assessment was completed, the case was closed. MOB denies any additional CPS history. Parents denied having any additional questions regarding the policy.  MOB denies having any mental health history and reported she is currently doing well. MOB stated they have  a strong support system at home and denies any current SI or HI. MOB reported she receives [facility] resources and understands she can contact [place] to have infant added to benefits.   CSW provided education regarding the baby blues period versus PPD. CSW provided the New Mom Checklist and encouraged MOB to self evaluate and contact a medical professional if symptoms are noted at any time.  CSW provided review of Sudden Infant Death Syndrome (SIDS) precautions. MOB reported they have all essentials for infant, including a bassinet. MOB denies any barriers to follow-up care. MOB expressed no additional needs at this time.     CSW filing CPS report with [place] DSS. CSW identifies no further need for intervention and no barriers to discharge at this time.  CSW Plan/Description:  No Further Intervention Required/No Barriers to [place] Protective Service Report ,Perinatal Mood and Anxiety Disorder (PMADs) Education,Sudden Infant Death Syndrome (SIDS) Education,Other Information/Referral to [employer] Education    Waylan Boga, [place] 01/22/2021, 4:13 PM

## 2021-01-26 ENCOUNTER — Encounter: Payer: Medicaid Other | Admitting: Certified Nurse Midwife

## 2021-02-02 ENCOUNTER — Telehealth: Payer: Self-pay | Admitting: *Deleted

## 2021-02-02 NOTE — Telephone Encounter (Signed)
-----   Message from Calvert Cantor, [place] sent at 01/21/2021  7:20 PM EDT ----- Regarding: Postpartum Visit Please schedule this patient for Postpartum visit in: 4 weeks with the following provider: Any provider Virtual For C/S patients schedule nurse incision check in weeks 2 weeks: no Low risk pregnancy complicated by: N/A Delivery mode:  SVD Anticipated Birth Control:  Condoms PP Procedures needed: N/A  Edinburgh: negative Schedule Integrated BH visit: no   relevant baby issues

## 2021-02-02 NOTE — Telephone Encounter (Signed)
Left patient an urgent message to call and schedule 4 week Postpartum appointment, virtual.

## 2021-02-03 ENCOUNTER — Ambulatory Visit: Payer: Medicaid Other

## 2021-02-16 NOTE — Progress Notes (Deleted)
    Post Partum Visit Note  Anna [name] is a 25 y.o. G50P2002 female who presents for a postpartum visit. She is 4 weeks postpartum following a normal spontaneous vaginal delivery.  I have fully reviewed the prenatal and intrapartum course. The delivery was at 38.2 gestational weeks.  Anesthesia: epidural. Postpartum course has been unremarkable. Baby is doing well.Pecola Leisure is feeding by {breast/bottle:69}. Bleeding {vag bleed:12292}. Bowel function is {normal:32111}. Bladder function is {normal:32111}. Patient {is/is not:9024} sexually active. Contraception method is condoms. Postpartum depression screening: {gen negative/positive:315881}.   The pregnancy intention screening data noted above was reviewed. Potential methods of contraception were discussed. The patient elected to proceed with {Upstream End Methods:24109}.     Health Maintenance Due  Topic Date Due  . COVID-19 Vaccine (1) Never done  . HPV VACCINES (1 - 2-dose series) Never done  . TETANUS/TDAP  Never done  . PAP-Cervical Cytology Screening  Never done  . PAP SMEAR-Modifier  Never done    {Common ambulatory SmartLinks:19316}  Review of Systems {ros; complete:30496}  Objective:  LMP 04/23/2020    General:  {gen appearance:16600}   Breasts:  {desc; normal/abnormal/not indicated:14647}  Lungs: {lung exam:16931}  Heart:  {heart exam:5510}  Abdomen: {abdomen exam:16834}   Wound {Wound assessment:11097}  GU exam:  {desc; normal/abnormal/not indicated:14647}       Assessment:    There are no diagnoses linked to this encounter.  *** postpartum exam.   Plan:   Essential components of care per ACOG recommendations:  1.  Mood and well being: Patient with {gen negative/positive:315881} depression screening today. Reviewed local resources for support.  - Patient tobacco use? {tobacco use:25506}  - hx of drug use? {yes/no:25505}    2. Infant care and feeding:  -Patient currently breastmilk feeding? {yes/no:25502}   -Social determinants of health (SDOH) reviewed in EPIC. No concerns***The following needs were identified***  3. Sexuality, contraception and birth spacing - Patient {DOES_DOES NFA:21308} want a pregnancy in the next year.  Desired family size is {NUMBER 1-10:22536} children.  - Reviewed forms of contraception in tiered fashion. Patient desired {PLAN CONTRACEPTION:313102} today.   - Discussed birth spacing of 18 months  4. Sleep and fatigue -Encouraged family/partner/community support of 4 hrs of uninterrupted sleep to help with mood and fatigue  5. Physical Recovery  - Discussed patients delivery and complications. She describes her labor as {description:25511} - Patient had a {CHL AMB [phone]}. Patient had a {laceration:25518} laceration. Perineal healing reviewed. Patient expressed understanding - Patient has urinary incontinence? {yes/no:25515} - Patient {ACTION; IS/IS MVH:[id number]} safe to resume physical and sexual activity  6.  Health Maintenance - HM due items addressed {Yes or If no, why not?:20788} - Last pap smear No results found for: DIAGPAP Pap smear {done:10129} at today's visit.  -Breast Cancer screening indicated? {indicated:25516}  7. Chronic Disease/Pregnancy Condition follow up: {Follow up:25499}  - PCP follow up  Kathie Dike, CMA [facility] [employer], [facility]

## 2021-02-19 ENCOUNTER — Telehealth: Payer: Self-pay | Admitting: *Deleted

## 2021-02-19 ENCOUNTER — Ambulatory Visit: Payer: Medicaid Other | Admitting: Certified Nurse Midwife

## 2021-02-19 NOTE — Telephone Encounter (Signed)
Left patient an urgent message to call and reschedule NO SHOW Postpartum appointment on 02/19/2021 at 9:10 AM with a 8:55 AM arrival time as soon as possible. Postpartum must be completed within a certain timeframe for insurance to cover.

## 2021-10-08 IMAGING — US US OB TRANSVAGINAL
1 series · 14 of 28 positions shown · non-contrast
Comparison: 07/02/2020

CLINICAL DATA: Vaginal bleeding in 1st trimester pregnancy.

EXAM:
TRANSVAGINAL OB ULTRASOUND
TECHNIQUE: Transvaginal ultrasound was performed for complete evaluation of the
gestation as well as the maternal uterus, adnexal regions, and
pelvic cul-de-sac.

[Series 1: us ob transvaginal · 0.15mm/px · 14 of 80 slices shown]
[im 3/80]
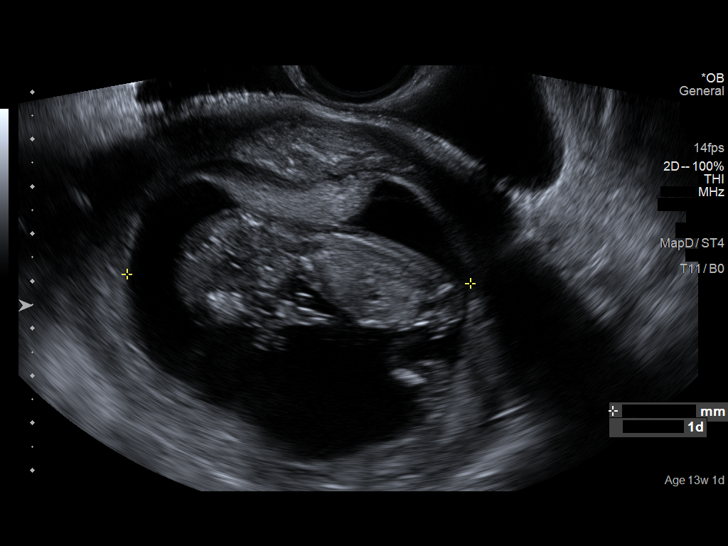
[im 9/80]
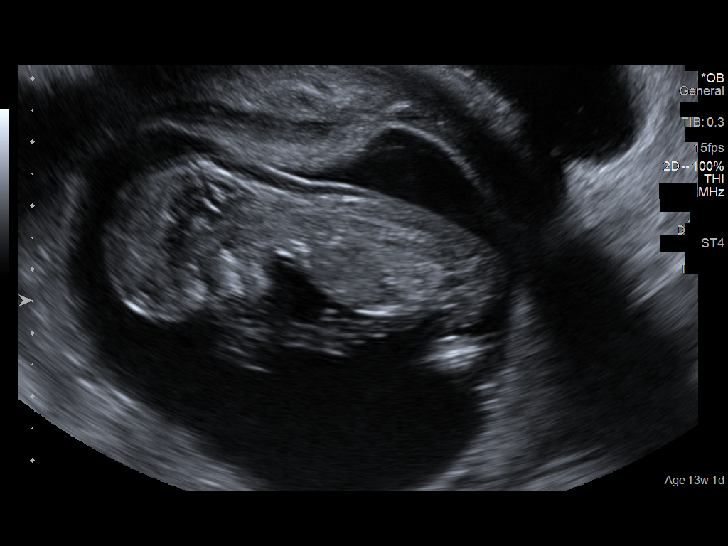
[im 15/80]
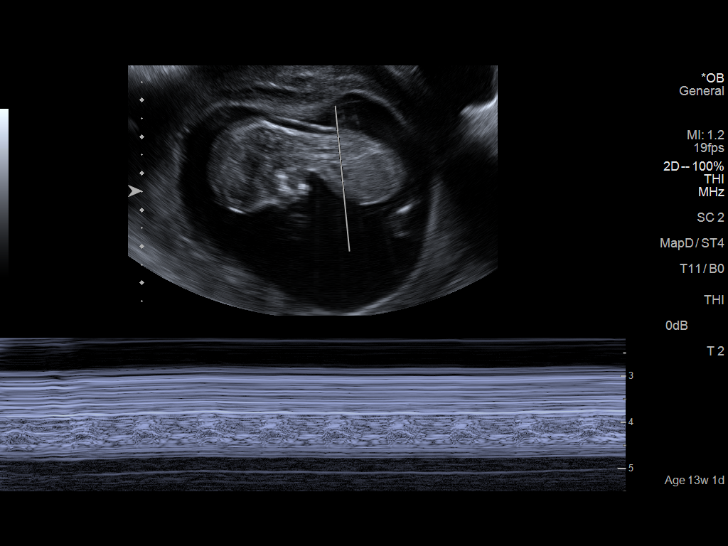
[im 21/80]
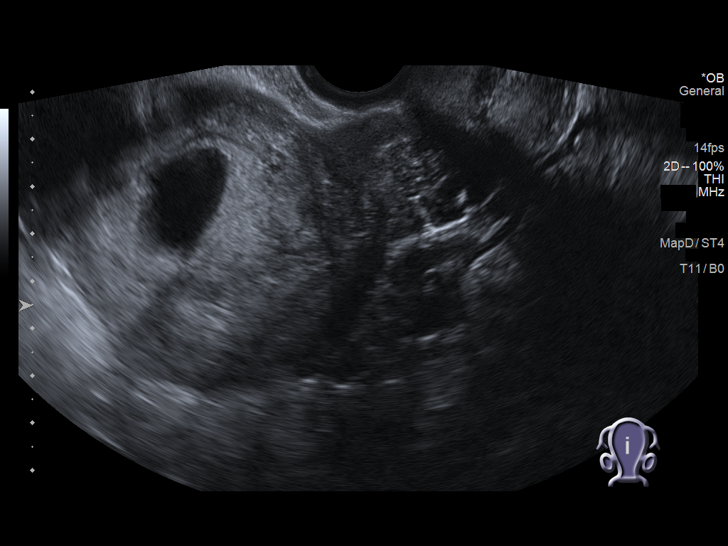
[im 27/80]
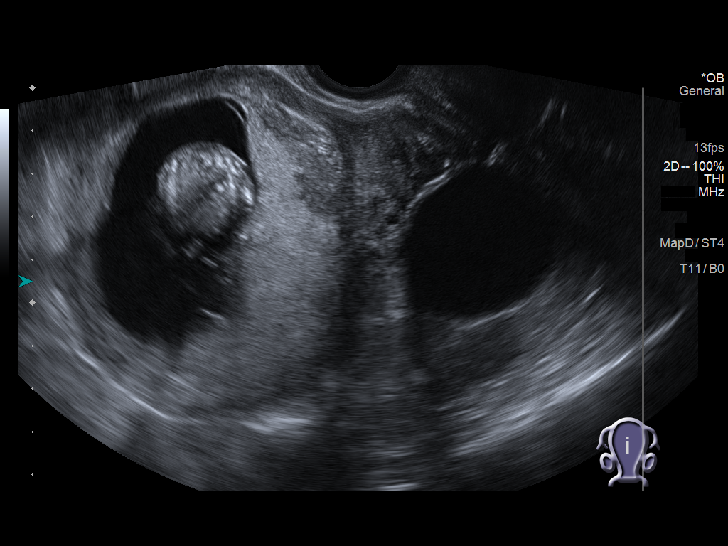
[im 33/80]
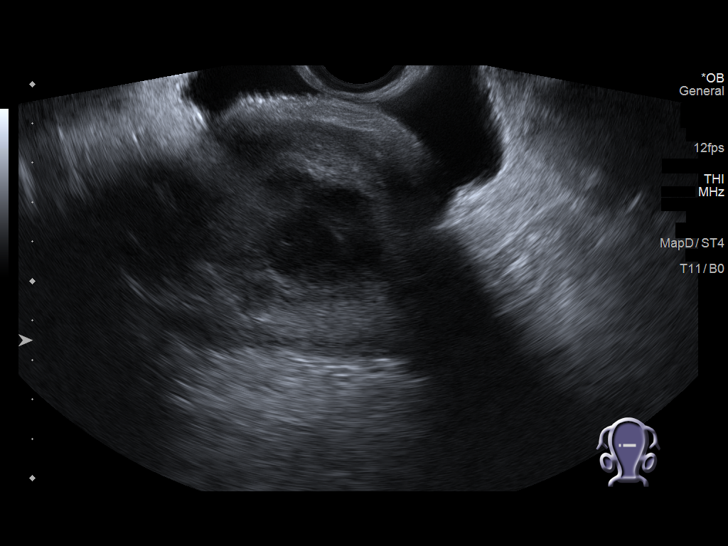
[im 39/80]
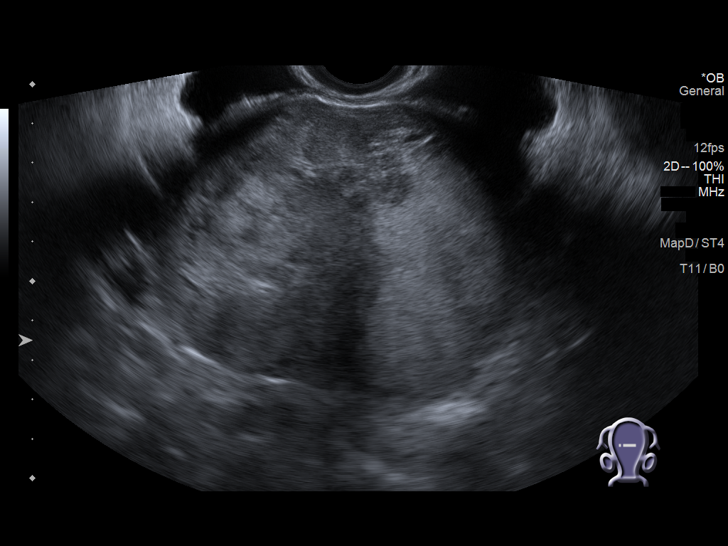
[im 44/80]
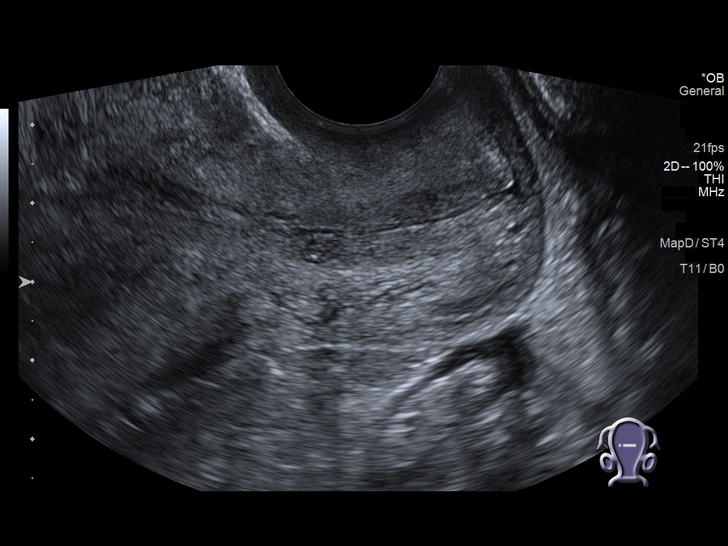
[im 50/80]
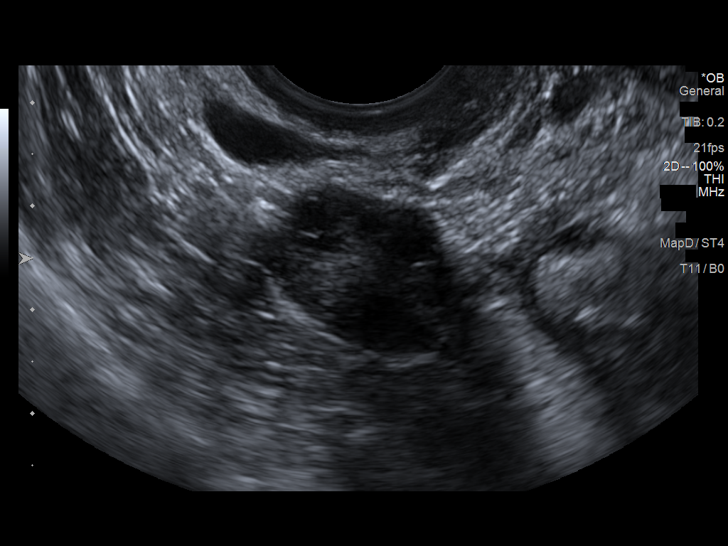
[im 56/80]
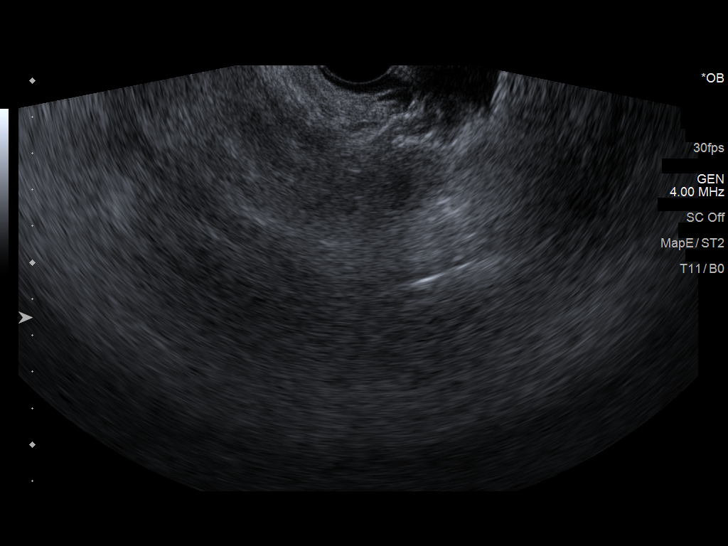
[im 62/80]
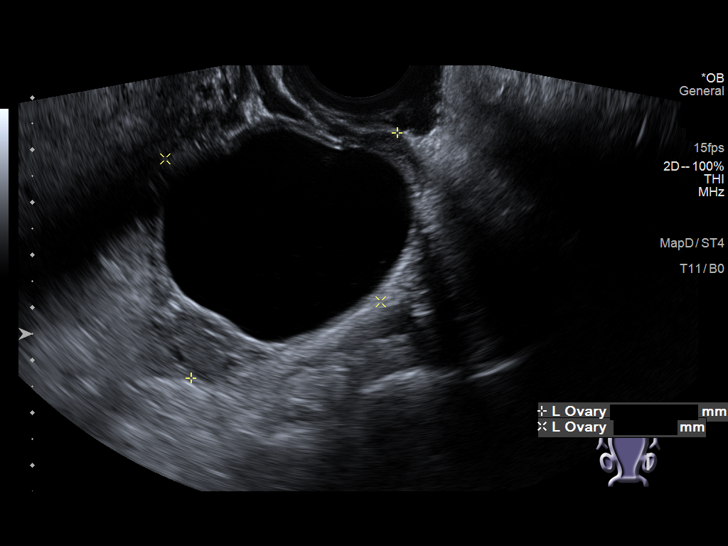
[im 68/80]
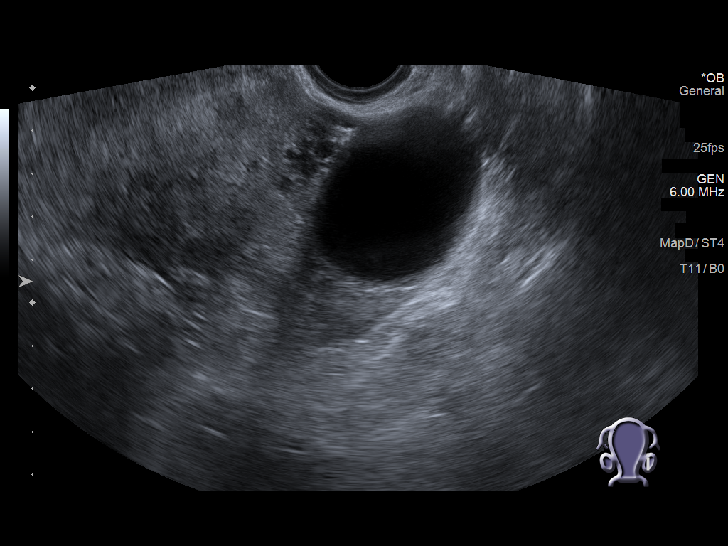
[im 74/80]
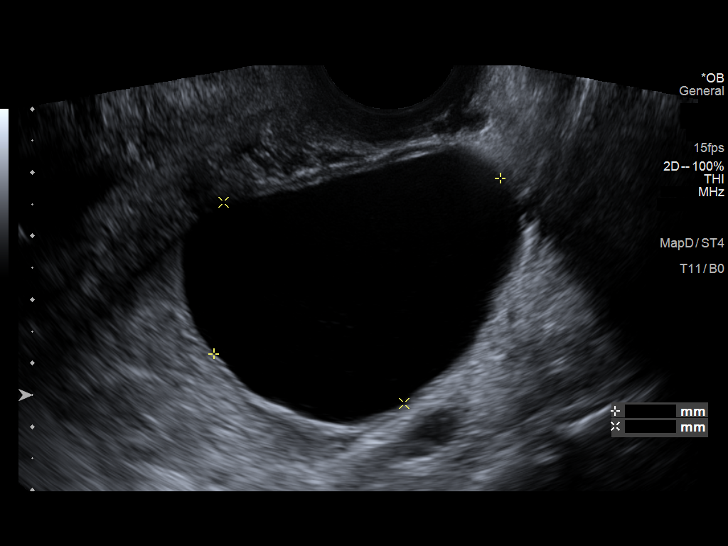
[im 80/80]
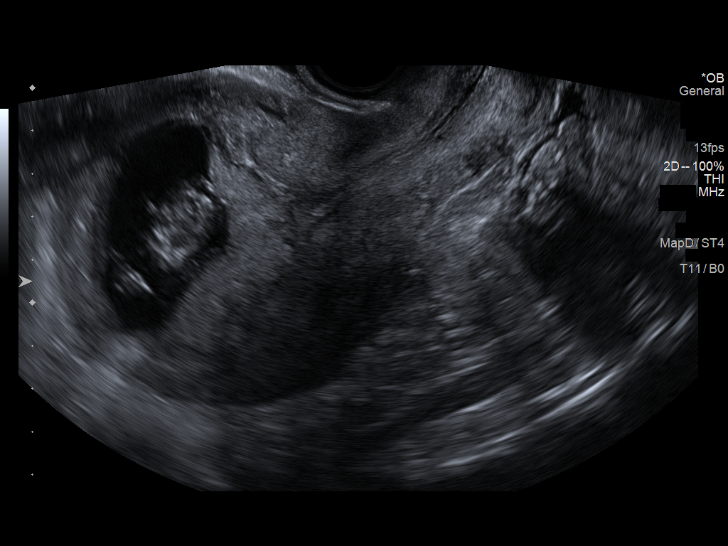

[14 of 28 positions shown; findings below may reference images not displayed]

FINDINGS: Intrauterine gestational sac: Single

Yolk sac:  Not Visualized.

Embryo:  Visualized.

Cardiac Activity: Visualized.

Heart Rate: 160 bpm

CRL:   64 mm   12 w 5 d                  US EDC: 01/31/2021

Subchorionic hemorrhage:  None visualized.

Maternal uterus/adnexae: Normal appearance of right ovary. A simple
cyst is again seen in the left ovary which measures 5.3 x 4.3 x
cm. This shows no significant change in size or appearance since
previous study. No abnormal free fluid identified.
IMPRESSION: Assigned gestational age is currently 13 weeks 1 day (if based on
LMP). Appropriate fetal growth.

5 cm benign-appearing left ovarian cyst, without significant change
compared to prior exam.
